# Patient Record
Sex: Female | Born: 1945
Health system: Southern US, Community
[De-identification: ages and names within clinical notes are randomized; demographics above are authoritative.]

## PROBLEM LIST (undated history)

## (undated) DIAGNOSIS — Z8041 Family history of malignant neoplasm of ovary: Secondary | ICD-10-CM

## (undated) DIAGNOSIS — T7840XA Allergy, unspecified, initial encounter: Secondary | ICD-10-CM

## (undated) DIAGNOSIS — C50919 Malignant neoplasm of unspecified site of unspecified female breast: Secondary | ICD-10-CM

## (undated) DIAGNOSIS — K5900 Constipation, unspecified: Secondary | ICD-10-CM

## (undated) DIAGNOSIS — Z803 Family history of malignant neoplasm of breast: Secondary | ICD-10-CM

## (undated) DIAGNOSIS — Z800 Family history of malignant neoplasm of digestive organs: Secondary | ICD-10-CM

## (undated) HISTORY — DX: Malignant neoplasm of unspecified site of unspecified female breast: C50.919

## (undated) HISTORY — DX: Constipation, unspecified: K59.00

## (undated) HISTORY — DX: Allergy, unspecified, initial encounter: T78.40XA

## (undated) HISTORY — DX: Family history of malignant neoplasm of digestive organs: Z80.0

## (undated) HISTORY — DX: Family history of malignant neoplasm of breast: Z80.3

## (undated) HISTORY — DX: Family history of malignant neoplasm of ovary: Z80.41

---

## 1983-06-05 HISTORY — PX: BUNIONECTOMY: SHX129

## 1999-08-01 ENCOUNTER — Encounter: Admission: RE | Admit: 1999-08-01 | Discharge: 1999-08-01 | Payer: Self-pay | Admitting: Obstetrics and Gynecology

## 1999-08-01 ENCOUNTER — Encounter: Payer: Self-pay | Admitting: Obstetrics and Gynecology

## 2000-08-05 ENCOUNTER — Encounter: Payer: Self-pay | Admitting: Obstetrics and Gynecology

## 2000-08-05 ENCOUNTER — Encounter: Admission: RE | Admit: 2000-08-05 | Discharge: 2000-08-05 | Payer: Self-pay | Admitting: Obstetrics and Gynecology

## 2000-10-14 ENCOUNTER — Ambulatory Visit (HOSPITAL_COMMUNITY): Admission: RE | Admit: 2000-10-14 | Discharge: 2000-10-14 | Payer: Self-pay | Admitting: *Deleted

## 2001-08-06 ENCOUNTER — Encounter: Admission: RE | Admit: 2001-08-06 | Discharge: 2001-08-06 | Payer: Self-pay | Admitting: Obstetrics and Gynecology

## 2001-08-06 ENCOUNTER — Encounter: Payer: Self-pay | Admitting: Obstetrics and Gynecology

## 2002-08-11 ENCOUNTER — Encounter: Payer: Self-pay | Admitting: Obstetrics and Gynecology

## 2002-08-11 ENCOUNTER — Encounter: Admission: RE | Admit: 2002-08-11 | Discharge: 2002-08-11 | Payer: Self-pay | Admitting: Obstetrics and Gynecology

## 2004-08-24 ENCOUNTER — Ambulatory Visit (HOSPITAL_COMMUNITY): Admission: RE | Admit: 2004-08-24 | Discharge: 2004-08-24 | Payer: Self-pay | Admitting: Obstetrics and Gynecology

## 2005-11-02 ENCOUNTER — Ambulatory Visit (HOSPITAL_COMMUNITY): Admission: RE | Admit: 2005-11-02 | Discharge: 2005-11-02 | Payer: Self-pay | Admitting: Obstetrics and Gynecology

## 2006-11-04 ENCOUNTER — Ambulatory Visit (HOSPITAL_COMMUNITY): Admission: RE | Admit: 2006-11-04 | Discharge: 2006-11-04 | Payer: Self-pay | Admitting: Obstetrics and Gynecology

## 2007-06-03 ENCOUNTER — Ambulatory Visit: Payer: Self-pay | Admitting: Gastroenterology

## 2007-06-05 DIAGNOSIS — C50919 Malignant neoplasm of unspecified site of unspecified female breast: Secondary | ICD-10-CM

## 2007-06-05 HISTORY — PX: BREAST LUMPECTOMY: SHX2

## 2007-06-05 HISTORY — DX: Malignant neoplasm of unspecified site of unspecified female breast: C50.919

## 2007-06-18 ENCOUNTER — Ambulatory Visit: Payer: Self-pay | Admitting: Gastroenterology

## 2007-11-28 ENCOUNTER — Ambulatory Visit (HOSPITAL_COMMUNITY): Admission: RE | Admit: 2007-11-28 | Discharge: 2007-11-28 | Payer: Self-pay | Admitting: Internal Medicine

## 2007-12-15 ENCOUNTER — Encounter: Admission: RE | Admit: 2007-12-15 | Discharge: 2007-12-15 | Payer: Self-pay | Admitting: Internal Medicine

## 2007-12-19 ENCOUNTER — Encounter (INDEPENDENT_AMBULATORY_CARE_PROVIDER_SITE_OTHER): Payer: Self-pay | Admitting: Diagnostic Radiology

## 2007-12-19 ENCOUNTER — Encounter: Admission: RE | Admit: 2007-12-19 | Discharge: 2007-12-19 | Payer: Self-pay | Admitting: Internal Medicine

## 2007-12-31 ENCOUNTER — Encounter: Admission: RE | Admit: 2007-12-31 | Discharge: 2007-12-31 | Payer: Self-pay | Admitting: Internal Medicine

## 2008-01-09 ENCOUNTER — Encounter: Admission: RE | Admit: 2008-01-09 | Discharge: 2008-01-09 | Payer: Self-pay | Admitting: General Surgery

## 2008-01-09 ENCOUNTER — Ambulatory Visit (HOSPITAL_COMMUNITY): Admission: RE | Admit: 2008-01-09 | Discharge: 2008-01-09 | Payer: Self-pay | Admitting: General Surgery

## 2008-01-09 ENCOUNTER — Encounter (INDEPENDENT_AMBULATORY_CARE_PROVIDER_SITE_OTHER): Payer: Self-pay | Admitting: General Surgery

## 2008-01-16 ENCOUNTER — Ambulatory Visit: Payer: Self-pay | Admitting: Hematology

## 2008-02-02 LAB — CBC WITH DIFFERENTIAL/PLATELET
Eosinophils Absolute: 0.2 10*3/uL (ref 0.0–0.5)
LYMPH%: 30.2 % (ref 14.0–48.0)
MCH: 31.5 pg (ref 26.0–34.0)
MCHC: 34.5 g/dL (ref 32.0–36.0)
MCV: 91.1 fL (ref 81.0–101.0)
MONO%: 8.3 % (ref 0.0–13.0)
NEUT#: 3.8 10*3/uL (ref 1.5–6.5)
Platelets: 228 10*3/uL (ref 145–400)
RBC: 4.23 10*6/uL (ref 3.70–5.32)

## 2008-02-02 LAB — COMPREHENSIVE METABOLIC PANEL
ALT: 23 U/L (ref 0–35)
Albumin: 4.6 g/dL (ref 3.5–5.2)
CO2: 28 mEq/L (ref 19–32)
Calcium: 10 mg/dL (ref 8.4–10.5)
Chloride: 105 mEq/L (ref 96–112)
Potassium: 4.3 mEq/L (ref 3.5–5.3)
Sodium: 141 mEq/L (ref 135–145)
Total Protein: 7.1 g/dL (ref 6.0–8.3)

## 2008-02-04 ENCOUNTER — Ambulatory Visit: Admission: RE | Admit: 2008-02-04 | Discharge: 2008-02-17 | Payer: Self-pay | Admitting: Radiation Oncology

## 2008-02-06 ENCOUNTER — Ambulatory Visit (HOSPITAL_COMMUNITY): Admission: RE | Admit: 2008-02-06 | Discharge: 2008-02-06 | Payer: Self-pay | Admitting: Oncology

## 2008-02-11 ENCOUNTER — Ambulatory Visit (HOSPITAL_COMMUNITY): Admission: RE | Admit: 2008-02-11 | Discharge: 2008-02-11 | Payer: Self-pay | Admitting: Oncology

## 2008-02-11 LAB — COMPREHENSIVE METABOLIC PANEL
Albumin: 4.4 g/dL (ref 3.5–5.2)
BUN: 18 mg/dL (ref 6–23)
CO2: 27 mEq/L (ref 19–32)
Calcium: 9.3 mg/dL (ref 8.4–10.5)
Glucose, Bld: 126 mg/dL — ABNORMAL HIGH (ref 70–99)
Potassium: 4 mEq/L (ref 3.5–5.3)
Sodium: 141 mEq/L (ref 135–145)
Total Protein: 6.9 g/dL (ref 6.0–8.3)

## 2008-02-11 LAB — CBC WITH DIFFERENTIAL/PLATELET
Basophils Absolute: 0.1 10*3/uL (ref 0.0–0.1)
Eosinophils Absolute: 0.2 10*3/uL (ref 0.0–0.5)
HGB: 13.3 g/dL (ref 11.6–15.9)
LYMPH%: 30.1 % (ref 14.0–48.0)
MCV: 88 fL (ref 81.0–101.0)
MONO#: 0.4 10*3/uL (ref 0.1–0.9)
MONO%: 6.2 % (ref 0.0–13.0)
NEUT#: 3.9 10*3/uL (ref 1.5–6.5)
Platelets: 189 10*3/uL (ref 145–400)
RBC: 4.22 10*6/uL (ref 3.70–5.32)
RDW: 12 % (ref 11.3–14.5)
WBC: 6.5 10*3/uL (ref 3.9–10.0)

## 2008-02-18 LAB — CBC WITH DIFFERENTIAL/PLATELET
Basophils Absolute: 0 10*3/uL (ref 0.0–0.1)
EOS%: 16.1 % — ABNORMAL HIGH (ref 0.0–7.0)
Eosinophils Absolute: 0.3 10*3/uL (ref 0.0–0.5)
HCT: 31.9 % — ABNORMAL LOW (ref 34.8–46.6)
HGB: 11.2 g/dL — ABNORMAL LOW (ref 11.6–15.9)
MCH: 31.8 pg (ref 26.0–34.0)
MCV: 90.6 fL (ref 81.0–101.0)
MONO%: 5.2 % (ref 0.0–13.0)
NEUT#: 0.3 10*3/uL — CL (ref 1.5–6.5)
NEUT%: 13.9 % — ABNORMAL LOW (ref 39.6–76.8)
Platelets: 100 10*3/uL — ABNORMAL LOW (ref 145–400)
RDW: 12.9 % (ref 11.3–14.5)

## 2008-02-24 ENCOUNTER — Encounter: Admission: RE | Admit: 2008-02-24 | Discharge: 2008-02-24 | Payer: Self-pay | Admitting: Interventional Radiology

## 2008-02-25 LAB — COMPREHENSIVE METABOLIC PANEL
AST: 17 U/L (ref 0–37)
Alkaline Phosphatase: 81 U/L (ref 39–117)
BUN: 14 mg/dL (ref 6–23)
Glucose, Bld: 94 mg/dL (ref 70–99)
Sodium: 141 mEq/L (ref 135–145)
Total Bilirubin: 0.4 mg/dL (ref 0.3–1.2)

## 2008-02-25 LAB — CBC WITH DIFFERENTIAL/PLATELET
Basophils Absolute: 0.1 10*3/uL (ref 0.0–0.1)
EOS%: 0.6 % (ref 0.0–7.0)
Eosinophils Absolute: 0 10*3/uL (ref 0.0–0.5)
LYMPH%: 26.3 % (ref 14.0–48.0)
MCH: 31.1 pg (ref 26.0–34.0)
MCV: 89.7 fL (ref 81.0–101.0)
MONO%: 6.1 % (ref 0.0–13.0)
NEUT#: 4.1 10*3/uL (ref 1.5–6.5)
Platelets: 171 10*3/uL (ref 145–400)
RBC: 3.89 10*6/uL (ref 3.70–5.32)

## 2008-03-03 ENCOUNTER — Ambulatory Visit: Payer: Self-pay | Admitting: Hematology

## 2008-03-03 LAB — CBC WITH DIFFERENTIAL/PLATELET
Basophils Absolute: 0 10*3/uL (ref 0.0–0.1)
Eosinophils Absolute: 0.1 10*3/uL (ref 0.0–0.5)
HCT: 31.6 % — ABNORMAL LOW (ref 34.8–46.6)
HGB: 11.1 g/dL — ABNORMAL LOW (ref 11.6–15.9)
LYMPH%: 63.2 % — ABNORMAL HIGH (ref 14.0–48.0)
MCV: 90.8 fL (ref 81.0–101.0)
MONO%: 7.5 % (ref 0.0–13.0)
NEUT#: 0.4 10*3/uL — CL (ref 1.5–6.5)
NEUT%: 22.8 % — ABNORMAL LOW (ref 39.6–76.8)
Platelets: 134 10*3/uL — ABNORMAL LOW (ref 145–400)

## 2008-03-10 LAB — CBC WITH DIFFERENTIAL/PLATELET
BASO%: 1.8 % (ref 0.0–2.0)
Basophils Absolute: 0.2 10*3/uL — ABNORMAL HIGH (ref 0.0–0.1)
EOS%: 0.3 % (ref 0.0–7.0)
HCT: 34.2 % — ABNORMAL LOW (ref 34.8–46.6)
HGB: 12 g/dL (ref 11.6–15.9)
LYMPH%: 18.1 % (ref 14.0–48.0)
MCH: 31.3 pg (ref 26.0–34.0)
MCHC: 35 g/dL (ref 32.0–36.0)
MONO#: 0.5 10*3/uL (ref 0.1–0.9)
NEUT%: 74.5 % (ref 39.6–76.8)
Platelets: 160 10*3/uL (ref 145–400)
lymph#: 1.6 10*3/uL (ref 0.9–3.3)

## 2008-03-10 LAB — COMPREHENSIVE METABOLIC PANEL
ALT: 14 U/L (ref 0–35)
AST: 16 U/L (ref 0–37)
BUN: 16 mg/dL (ref 6–23)
Calcium: 9.4 mg/dL (ref 8.4–10.5)
Creatinine, Ser: 0.56 mg/dL (ref 0.40–1.20)
Total Bilirubin: 0.3 mg/dL (ref 0.3–1.2)

## 2008-03-17 LAB — CBC WITH DIFFERENTIAL/PLATELET
BASO%: 1.3 % (ref 0.0–2.0)
Basophils Absolute: 0 10*3/uL (ref 0.0–0.1)
EOS%: 4.8 % (ref 0.0–7.0)
HCT: 29.7 % — ABNORMAL LOW (ref 34.8–46.6)
HGB: 10.4 g/dL — ABNORMAL LOW (ref 11.6–15.9)
MCH: 31.8 pg (ref 26.0–34.0)
MCHC: 35 g/dL (ref 32.0–36.0)
MCV: 91 fL (ref 81.0–101.0)
MONO%: 7.9 % (ref 0.0–13.0)
NEUT%: 39.5 % — ABNORMAL LOW (ref 39.6–76.8)

## 2008-03-17 LAB — COMPREHENSIVE METABOLIC PANEL
ALT: 13 U/L (ref 0–35)
AST: 12 U/L (ref 0–37)
Alkaline Phosphatase: 110 U/L (ref 39–117)
BUN: 11 mg/dL (ref 6–23)
Calcium: 9.1 mg/dL (ref 8.4–10.5)
Creatinine, Ser: 0.54 mg/dL (ref 0.40–1.20)
Total Bilirubin: 0.6 mg/dL (ref 0.3–1.2)

## 2008-03-24 LAB — CBC WITH DIFFERENTIAL/PLATELET
Eosinophils Absolute: 0 10*3/uL (ref 0.0–0.5)
HCT: 32.3 % — ABNORMAL LOW (ref 34.8–46.6)
LYMPH%: 16 % (ref 14.0–48.0)
MCV: 88.7 fL (ref 81.0–101.0)
MONO%: 6.7 % (ref 0.0–13.0)
NEUT#: 6.2 10*3/uL (ref 1.5–6.5)
NEUT%: 75.5 % (ref 39.6–76.8)
Platelets: 169 10*3/uL (ref 145–400)
RBC: 3.64 10*6/uL — ABNORMAL LOW (ref 3.70–5.32)

## 2008-03-24 LAB — COMPREHENSIVE METABOLIC PANEL
Alkaline Phosphatase: 97 U/L (ref 39–117)
BUN: 12 mg/dL (ref 6–23)
CO2: 24 mEq/L (ref 19–32)
Creatinine, Ser: 0.6 mg/dL (ref 0.40–1.20)
Glucose, Bld: 88 mg/dL (ref 70–99)
Sodium: 140 mEq/L (ref 135–145)
Total Bilirubin: 0.4 mg/dL (ref 0.3–1.2)
Total Protein: 6.6 g/dL (ref 6.0–8.3)

## 2008-03-31 LAB — COMPREHENSIVE METABOLIC PANEL
Alkaline Phosphatase: 110 U/L (ref 39–117)
BUN: 15 mg/dL (ref 6–23)
CO2: 25 mEq/L (ref 19–32)
Glucose, Bld: 91 mg/dL (ref 70–99)
Total Bilirubin: 0.5 mg/dL (ref 0.3–1.2)
Total Protein: 6.2 g/dL (ref 6.0–8.3)

## 2008-03-31 LAB — CBC WITH DIFFERENTIAL/PLATELET
Basophils Absolute: 0 10*3/uL (ref 0.0–0.1)
Eosinophils Absolute: 0.1 10*3/uL (ref 0.0–0.5)
HCT: 28.9 % — ABNORMAL LOW (ref 34.8–46.6)
HGB: 10.2 g/dL — ABNORMAL LOW (ref 11.6–15.9)
LYMPH%: 43.3 % (ref 14.0–48.0)
MCV: 91.5 fL (ref 81.0–101.0)
MONO#: 0.1 10*3/uL (ref 0.1–0.9)
MONO%: 6.2 % (ref 0.0–13.0)
NEUT#: 0.6 10*3/uL — ABNORMAL LOW (ref 1.5–6.5)
NEUT%: 43.2 % (ref 39.6–76.8)
Platelets: 116 10*3/uL — ABNORMAL LOW (ref 145–400)
WBC: 1.3 10*3/uL — ABNORMAL LOW (ref 3.9–10.0)

## 2008-04-07 LAB — CBC WITH DIFFERENTIAL/PLATELET
Basophils Absolute: 0.1 10*3/uL (ref 0.0–0.1)
EOS%: 0.5 % (ref 0.0–7.0)
HCT: 31.6 % — ABNORMAL LOW (ref 34.8–46.6)
HGB: 11.1 g/dL — ABNORMAL LOW (ref 11.6–15.9)
MCH: 31.4 pg (ref 26.0–34.0)
MCV: 89.5 fL (ref 81.0–101.0)
MONO%: 6.4 % (ref 0.0–13.0)
NEUT%: 79.2 % — ABNORMAL HIGH (ref 39.6–76.8)
Platelets: 163 10*3/uL (ref 145–400)

## 2008-04-07 LAB — COMPREHENSIVE METABOLIC PANEL
AST: 18 U/L (ref 0–37)
Alkaline Phosphatase: 87 U/L (ref 39–117)
BUN: 13 mg/dL (ref 6–23)
Calcium: 9.7 mg/dL (ref 8.4–10.5)
Creatinine, Ser: 0.58 mg/dL (ref 0.40–1.20)

## 2008-04-19 ENCOUNTER — Ambulatory Visit: Payer: Self-pay | Admitting: Oncology

## 2008-04-21 LAB — COMPREHENSIVE METABOLIC PANEL
AST: 21 U/L (ref 0–37)
Albumin: 4.3 g/dL (ref 3.5–5.2)
BUN: 15 mg/dL (ref 6–23)
CO2: 24 mEq/L (ref 19–32)
Calcium: 9.2 mg/dL (ref 8.4–10.5)
Chloride: 105 mEq/L (ref 96–112)
Creatinine, Ser: 0.59 mg/dL (ref 0.40–1.20)
Glucose, Bld: 83 mg/dL (ref 70–99)

## 2008-04-21 LAB — CBC WITH DIFFERENTIAL/PLATELET
Basophils Absolute: 0.2 10*3/uL — ABNORMAL HIGH (ref 0.0–0.1)
EOS%: 0.8 % (ref 0.0–7.0)
Eosinophils Absolute: 0.1 10*3/uL (ref 0.0–0.5)
HCT: 31.2 % — ABNORMAL LOW (ref 34.8–46.6)
HGB: 11 g/dL — ABNORMAL LOW (ref 11.6–15.9)
MCH: 31.9 pg (ref 26.0–34.0)
NEUT#: 14.8 10*3/uL — ABNORMAL HIGH (ref 1.5–6.5)
NEUT%: 83.9 % — ABNORMAL HIGH (ref 39.6–76.8)
lymph#: 1.5 10*3/uL (ref 0.9–3.3)

## 2008-04-28 LAB — CBC WITH DIFFERENTIAL/PLATELET
BASO%: 0.3 % (ref 0.0–2.0)
Basophils Absolute: 0.1 10*3/uL (ref 0.0–0.1)
EOS%: 2.6 % (ref 0.0–7.0)
HCT: 27.7 % — ABNORMAL LOW (ref 34.8–46.6)
HGB: 9.7 g/dL — ABNORMAL LOW (ref 11.6–15.9)
LYMPH%: 7.5 % — ABNORMAL LOW (ref 14.0–48.0)
MCH: 32.7 pg (ref 26.0–34.0)
MCHC: 34.9 g/dL (ref 32.0–36.0)
MCV: 93.6 fL (ref 81.0–101.0)
MONO%: 2.7 % (ref 0.0–13.0)
NEUT%: 86.9 % — ABNORMAL HIGH (ref 39.6–76.8)
Platelets: 146 10*3/uL (ref 145–400)

## 2008-05-05 LAB — CBC WITH DIFFERENTIAL/PLATELET
Basophils Absolute: 0.1 10*3/uL (ref 0.0–0.1)
EOS%: 3.3 % (ref 0.0–7.0)
LYMPH%: 7.5 % — ABNORMAL LOW (ref 14.0–48.0)
MCH: 32.2 pg (ref 26.0–34.0)
MCV: 91.7 fL (ref 81.0–101.0)
MONO%: 5.8 % (ref 0.0–13.0)
Platelets: 176 10*3/uL (ref 145–400)
RBC: 3.33 10*6/uL — ABNORMAL LOW (ref 3.70–5.32)
RDW: 16.3 % — ABNORMAL HIGH (ref 11.3–14.5)

## 2008-05-05 LAB — COMPREHENSIVE METABOLIC PANEL
AST: 32 U/L (ref 0–37)
Albumin: 4.6 g/dL (ref 3.5–5.2)
Alkaline Phosphatase: 128 U/L — ABNORMAL HIGH (ref 39–117)
BUN: 14 mg/dL (ref 6–23)
Potassium: 4.4 mEq/L (ref 3.5–5.3)
Sodium: 141 mEq/L (ref 135–145)
Total Bilirubin: 0.5 mg/dL (ref 0.3–1.2)

## 2008-05-07 ENCOUNTER — Ambulatory Visit: Payer: Self-pay | Admitting: Genetic Counselor

## 2008-05-12 LAB — CBC WITH DIFFERENTIAL/PLATELET
Basophils Absolute: 0.1 10*3/uL (ref 0.0–0.1)
EOS%: 3.1 % (ref 0.0–7.0)
Eosinophils Absolute: 0.5 10*3/uL (ref 0.0–0.5)
HGB: 9.3 g/dL — ABNORMAL LOW (ref 11.6–15.9)
LYMPH%: 6.4 % — ABNORMAL LOW (ref 14.0–48.0)
MCH: 32.4 pg (ref 26.0–34.0)
MCV: 95.4 fL (ref 81.0–101.0)
MONO%: 5.7 % (ref 0.0–13.0)
NEUT#: 12.2 10*3/uL — ABNORMAL HIGH (ref 1.5–6.5)
NEUT%: 84.3 % — ABNORMAL HIGH (ref 39.6–76.8)
Platelets: 157 10*3/uL (ref 145–400)
RDW: 17 % — ABNORMAL HIGH (ref 11.3–14.5)

## 2008-05-12 LAB — COMPREHENSIVE METABOLIC PANEL
AST: 17 U/L (ref 0–37)
Albumin: 4.3 g/dL (ref 3.5–5.2)
Alkaline Phosphatase: 148 U/L — ABNORMAL HIGH (ref 39–117)
BUN: 13 mg/dL (ref 6–23)
Creatinine, Ser: 0.56 mg/dL (ref 0.40–1.20)
Glucose, Bld: 100 mg/dL — ABNORMAL HIGH (ref 70–99)
Total Bilirubin: 0.6 mg/dL (ref 0.3–1.2)

## 2008-05-19 LAB — COMPREHENSIVE METABOLIC PANEL
AST: 26 U/L (ref 0–37)
BUN: 14 mg/dL (ref 6–23)
Calcium: 9 mg/dL (ref 8.4–10.5)
Chloride: 107 mEq/L (ref 96–112)
Creatinine, Ser: 0.54 mg/dL (ref 0.40–1.20)

## 2008-05-19 LAB — CBC WITH DIFFERENTIAL/PLATELET
Basophils Absolute: 0 10*3/uL (ref 0.0–0.1)
EOS%: 2.6 % (ref 0.0–7.0)
Eosinophils Absolute: 0.2 10*3/uL (ref 0.0–0.5)
HCT: 32.4 % — ABNORMAL LOW (ref 34.8–46.6)
HGB: 11 g/dL — ABNORMAL LOW (ref 11.6–15.9)
MCH: 31.4 pg (ref 26.0–34.0)
MCV: 92.7 fL (ref 81.0–101.0)
MONO%: 5.3 % (ref 0.0–13.0)
NEUT#: 7.3 10*3/uL — ABNORMAL HIGH (ref 1.5–6.5)
NEUT%: 81.8 % — ABNORMAL HIGH (ref 39.6–76.8)
RDW: 14.9 % — ABNORMAL HIGH (ref 11.3–14.5)

## 2008-05-26 LAB — CBC WITH DIFFERENTIAL/PLATELET
Basophils Absolute: 0 10*3/uL (ref 0.0–0.1)
HCT: 27.8 % — ABNORMAL LOW (ref 34.8–46.6)
HGB: 9.6 g/dL — ABNORMAL LOW (ref 11.6–15.9)
MCH: 32.4 pg (ref 26.0–34.0)
MONO#: 0.3 10*3/uL (ref 0.1–0.9)
NEUT%: 74.8 % (ref 39.6–76.8)
WBC: 4.1 10*3/uL (ref 3.9–10.0)
lymph#: 0.6 10*3/uL — ABNORMAL LOW (ref 0.9–3.3)

## 2008-06-07 ENCOUNTER — Ambulatory Visit: Payer: Self-pay | Admitting: Oncology

## 2008-06-07 LAB — COMPREHENSIVE METABOLIC PANEL WITH GFR
ALT: 23 U/L (ref 0–35)
AST: 26 U/L (ref 0–37)
Albumin: 4.6 g/dL (ref 3.5–5.2)
Alkaline Phosphatase: 82 U/L (ref 39–117)
BUN: 11 mg/dL (ref 6–23)
CO2: 23 meq/L (ref 19–32)
Calcium: 9.3 mg/dL (ref 8.4–10.5)
Chloride: 107 meq/L (ref 96–112)
Creatinine, Ser: 0.6 mg/dL (ref 0.40–1.20)
Glucose, Bld: 86 mg/dL (ref 70–99)
Potassium: 4.2 meq/L (ref 3.5–5.3)
Sodium: 142 meq/L (ref 135–145)
Total Bilirubin: 0.7 mg/dL (ref 0.3–1.2)
Total Protein: 6.6 g/dL (ref 6.0–8.3)

## 2008-06-07 LAB — CBC WITH DIFFERENTIAL/PLATELET
BASO%: 0.6 % (ref 0.0–2.0)
Basophils Absolute: 0 10e3/uL (ref 0.0–0.1)
EOS%: 3.2 % (ref 0.0–7.0)
Eosinophils Absolute: 0.1 10e3/uL (ref 0.0–0.5)
HCT: 34.1 % — ABNORMAL LOW (ref 34.8–46.6)
HGB: 11.6 g/dL (ref 11.6–15.9)
LYMPH%: 23.2 % (ref 14.0–48.0)
MCH: 32 pg (ref 26.0–34.0)
MCHC: 34.1 g/dL (ref 32.0–36.0)
MCV: 94 fL (ref 81.0–101.0)
MONO#: 0.4 10e3/uL (ref 0.1–0.9)
MONO%: 15.8 % — ABNORMAL HIGH (ref 0.0–13.0)
NEUT#: 1.6 10e3/uL (ref 1.5–6.5)
NEUT%: 57.2 % (ref 39.6–76.8)
Platelets: 224 10e3/uL (ref 145–400)
RBC: 3.63 10e6/uL — ABNORMAL LOW (ref 3.70–5.32)
RDW: 14.8 % — ABNORMAL HIGH (ref 11.3–14.5)
WBC: 2.8 10e3/uL — ABNORMAL LOW (ref 3.9–10.0)
lymph#: 0.7 10e3/uL — ABNORMAL LOW (ref 0.9–3.3)

## 2008-06-08 ENCOUNTER — Ambulatory Visit: Admission: RE | Admit: 2008-06-08 | Discharge: 2008-07-26 | Payer: Self-pay | Admitting: Radiation Oncology

## 2008-07-08 LAB — CBC WITH DIFFERENTIAL/PLATELET
Basophils Absolute: 0 10*3/uL (ref 0.0–0.1)
EOS%: 2.5 % (ref 0.0–7.0)
HCT: 34.8 % (ref 34.8–46.6)
HGB: 12 g/dL (ref 11.6–15.9)
MCH: 31.1 pg (ref 26.0–34.0)
MCV: 90.3 fL (ref 81.0–101.0)
MONO%: 11 % (ref 0.0–13.0)
NEUT%: 67.3 % (ref 39.6–76.8)
Platelets: 154 10*3/uL (ref 145–400)

## 2008-08-09 ENCOUNTER — Ambulatory Visit: Payer: Self-pay | Admitting: Oncology

## 2008-08-11 LAB — CBC WITH DIFFERENTIAL/PLATELET
Basophils Absolute: 0 10*3/uL (ref 0.0–0.1)
EOS%: 1.6 % (ref 0.0–7.0)
HGB: 11.9 g/dL (ref 11.6–15.9)
MCH: 30.4 pg (ref 25.1–34.0)
MCV: 87.9 fL (ref 79.5–101.0)
MONO%: 13.2 % (ref 0.0–14.0)
NEUT%: 64.3 % (ref 38.4–76.8)
RDW: 13.7 % (ref 11.2–14.5)

## 2008-08-11 LAB — COMPREHENSIVE METABOLIC PANEL
AST: 19 U/L (ref 0–37)
Alkaline Phosphatase: 70 U/L (ref 39–117)
BUN: 13 mg/dL (ref 6–23)
Creatinine, Ser: 0.56 mg/dL (ref 0.40–1.20)
Potassium: 4.8 mEq/L (ref 3.5–5.3)
Total Bilirubin: 0.5 mg/dL (ref 0.3–1.2)

## 2008-08-23 ENCOUNTER — Ambulatory Visit (HOSPITAL_COMMUNITY): Admission: RE | Admit: 2008-08-23 | Discharge: 2008-08-23 | Payer: Self-pay | Admitting: Oncology

## 2008-11-29 ENCOUNTER — Encounter: Admission: RE | Admit: 2008-11-29 | Discharge: 2008-11-29 | Payer: Self-pay | Admitting: Oncology

## 2008-12-01 ENCOUNTER — Ambulatory Visit: Payer: Self-pay | Admitting: Oncology

## 2008-12-03 LAB — COMPREHENSIVE METABOLIC PANEL
AST: 21 U/L (ref 0–37)
Albumin: 4.2 g/dL (ref 3.5–5.2)
Alkaline Phosphatase: 66 U/L (ref 39–117)
BUN: 11 mg/dL (ref 6–23)
Potassium: 4.1 mEq/L (ref 3.5–5.3)
Sodium: 144 mEq/L (ref 135–145)

## 2008-12-03 LAB — CBC WITH DIFFERENTIAL/PLATELET
Basophils Absolute: 0 10*3/uL (ref 0.0–0.1)
EOS%: 3.2 % (ref 0.0–7.0)
Eosinophils Absolute: 0.1 10*3/uL (ref 0.0–0.5)
LYMPH%: 24.1 % (ref 14.0–49.7)
MCH: 32 pg (ref 25.1–34.0)
MCV: 91.9 fL (ref 79.5–101.0)
MONO%: 8.9 % (ref 0.0–14.0)
Platelets: 180 10*3/uL (ref 145–400)
RBC: 3.88 10*6/uL (ref 3.70–5.45)
RDW: 13 % (ref 11.2–14.5)

## 2009-04-05 ENCOUNTER — Ambulatory Visit: Payer: Self-pay | Admitting: Oncology

## 2009-04-07 LAB — CBC WITH DIFFERENTIAL/PLATELET
BASO%: 0.4 % (ref 0.0–2.0)
EOS%: 2.2 % (ref 0.0–7.0)
Eosinophils Absolute: 0.1 10*3/uL (ref 0.0–0.5)
LYMPH%: 22.3 % (ref 14.0–49.7)
MCH: 32.5 pg (ref 25.1–34.0)
MCHC: 34.9 g/dL (ref 31.5–36.0)
MCV: 93.2 fL (ref 79.5–101.0)
MONO%: 8.7 % (ref 0.0–14.0)
NEUT#: 2.9 10*3/uL (ref 1.5–6.5)
RBC: 3.95 10*6/uL (ref 3.70–5.45)
RDW: 13.1 % (ref 11.2–14.5)

## 2009-04-07 LAB — COMPREHENSIVE METABOLIC PANEL
AST: 19 U/L (ref 0–37)
Albumin: 4.5 g/dL (ref 3.5–5.2)
Alkaline Phosphatase: 62 U/L (ref 39–117)
Potassium: 4.1 mEq/L (ref 3.5–5.3)
Sodium: 143 mEq/L (ref 135–145)
Total Bilirubin: 0.6 mg/dL (ref 0.3–1.2)
Total Protein: 6.7 g/dL (ref 6.0–8.3)

## 2009-08-01 ENCOUNTER — Ambulatory Visit: Payer: Self-pay | Admitting: Oncology

## 2009-08-04 LAB — COMPREHENSIVE METABOLIC PANEL
AST: 22 U/L (ref 0–37)
Alkaline Phosphatase: 56 U/L (ref 39–117)
BUN: 14 mg/dL (ref 6–23)
CO2: 21 mEq/L (ref 19–32)
Chloride: 106 mEq/L (ref 96–112)
Creatinine, Ser: 0.61 mg/dL (ref 0.40–1.20)
Glucose, Bld: 87 mg/dL (ref 70–99)
Potassium: 4.4 mEq/L (ref 3.5–5.3)
Sodium: 141 mEq/L (ref 135–145)
Total Bilirubin: 0.6 mg/dL (ref 0.3–1.2)
Total Protein: 6.8 g/dL (ref 6.0–8.3)

## 2009-08-04 LAB — CBC WITH DIFFERENTIAL/PLATELET
BASO%: 0.6 % (ref 0.0–2.0)
Basophils Absolute: 0 10*3/uL (ref 0.0–0.1)
EOS%: 1.4 % (ref 0.0–7.0)
MONO#: 0.4 10*3/uL (ref 0.1–0.9)
NEUT%: 62.8 % (ref 38.4–76.8)
Platelets: 193 10*3/uL (ref 145–400)

## 2009-11-15 ENCOUNTER — Ambulatory Visit: Payer: Self-pay | Admitting: Oncology

## 2009-11-17 LAB — CBC WITH DIFFERENTIAL/PLATELET
BASO%: 0.5 % (ref 0.0–2.0)
Basophils Absolute: 0 10*3/uL (ref 0.0–0.1)
LYMPH%: 28.6 % (ref 14.0–49.7)
MCHC: 35.1 g/dL (ref 31.5–36.0)
MCV: 91.7 fL (ref 79.5–101.0)
MONO#: 0.5 10*3/uL (ref 0.1–0.9)
NEUT#: 2.9 10*3/uL (ref 1.5–6.5)
NEUT%: 60.1 % (ref 38.4–76.8)
Platelets: 192 10*3/uL (ref 145–400)
RBC: 3.98 10*6/uL (ref 3.70–5.45)
WBC: 4.9 10*3/uL (ref 3.9–10.3)
lymph#: 1.4 10*3/uL (ref 0.9–3.3)

## 2009-11-17 LAB — COMPREHENSIVE METABOLIC PANEL
AST: 21 U/L (ref 0–37)
Albumin: 4.4 g/dL (ref 3.5–5.2)
BUN: 14 mg/dL (ref 6–23)
Glucose, Bld: 96 mg/dL (ref 70–99)

## 2009-11-30 ENCOUNTER — Encounter: Admission: RE | Admit: 2009-11-30 | Discharge: 2009-11-30 | Payer: Self-pay | Admitting: Oncology

## 2010-02-23 ENCOUNTER — Ambulatory Visit: Payer: Self-pay | Admitting: Oncology

## 2010-02-27 LAB — COMPREHENSIVE METABOLIC PANEL
AST: 22 U/L (ref 0–37)
Creatinine, Ser: 0.55 mg/dL (ref 0.40–1.20)
Potassium: 4.2 mEq/L (ref 3.5–5.3)
Sodium: 143 mEq/L (ref 135–145)
Total Protein: 6.3 g/dL (ref 6.0–8.3)

## 2010-02-27 LAB — CBC WITH DIFFERENTIAL/PLATELET
EOS%: 1.2 % (ref 0.0–7.0)
HGB: 12.5 g/dL (ref 11.6–15.9)
MCHC: 34.4 g/dL (ref 31.5–36.0)
MONO#: 0.4 10*3/uL (ref 0.1–0.9)
MONO%: 8.7 % (ref 0.0–14.0)
NEUT#: 2.9 10*3/uL (ref 1.5–6.5)
Platelets: 188 10*3/uL (ref 145–400)
RDW: 13.1 % (ref 11.2–14.5)

## 2010-03-09 IMAGING — CR DG CHEST 2V
2 series · 2 of 2 positions shown · non-contrast
Comparison: None

CLINICAL DATA: History given of right breast carcinoma.

CHEST - 2 VIEW

[view not recorded (1 of 2)]
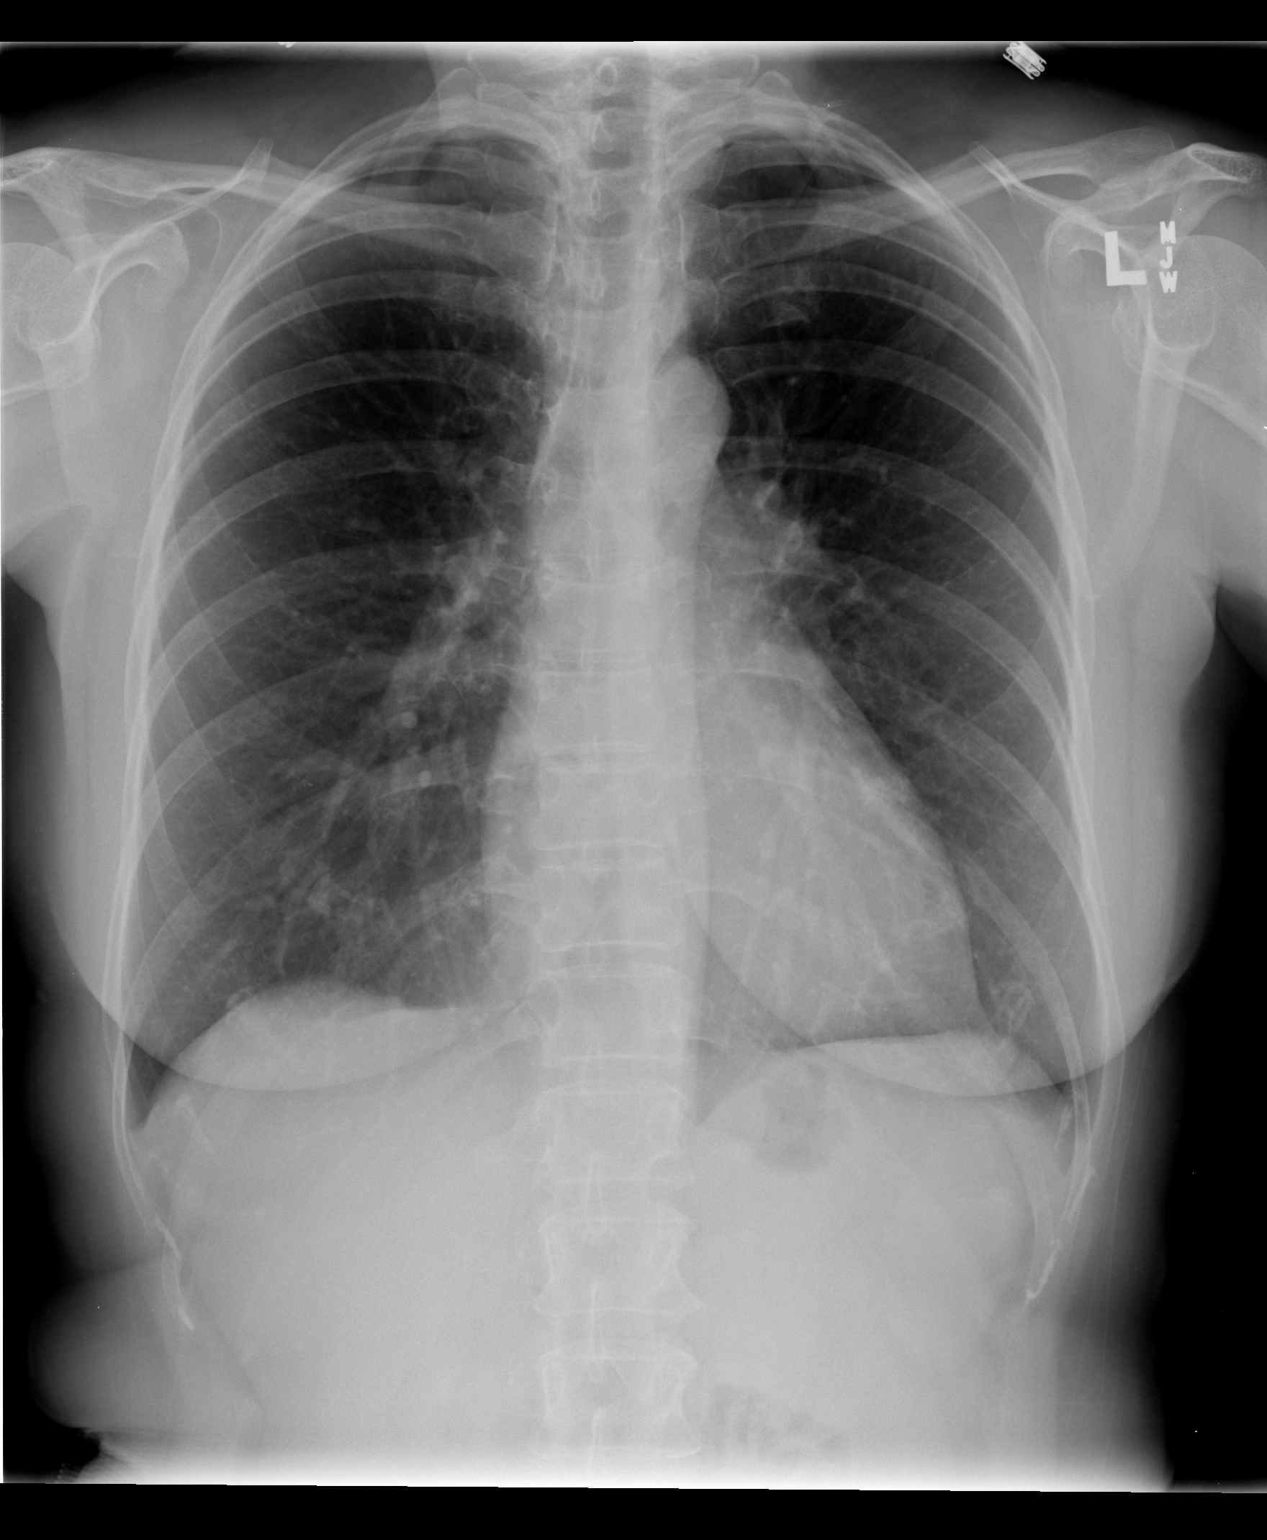

[view not recorded (2 of 2)]
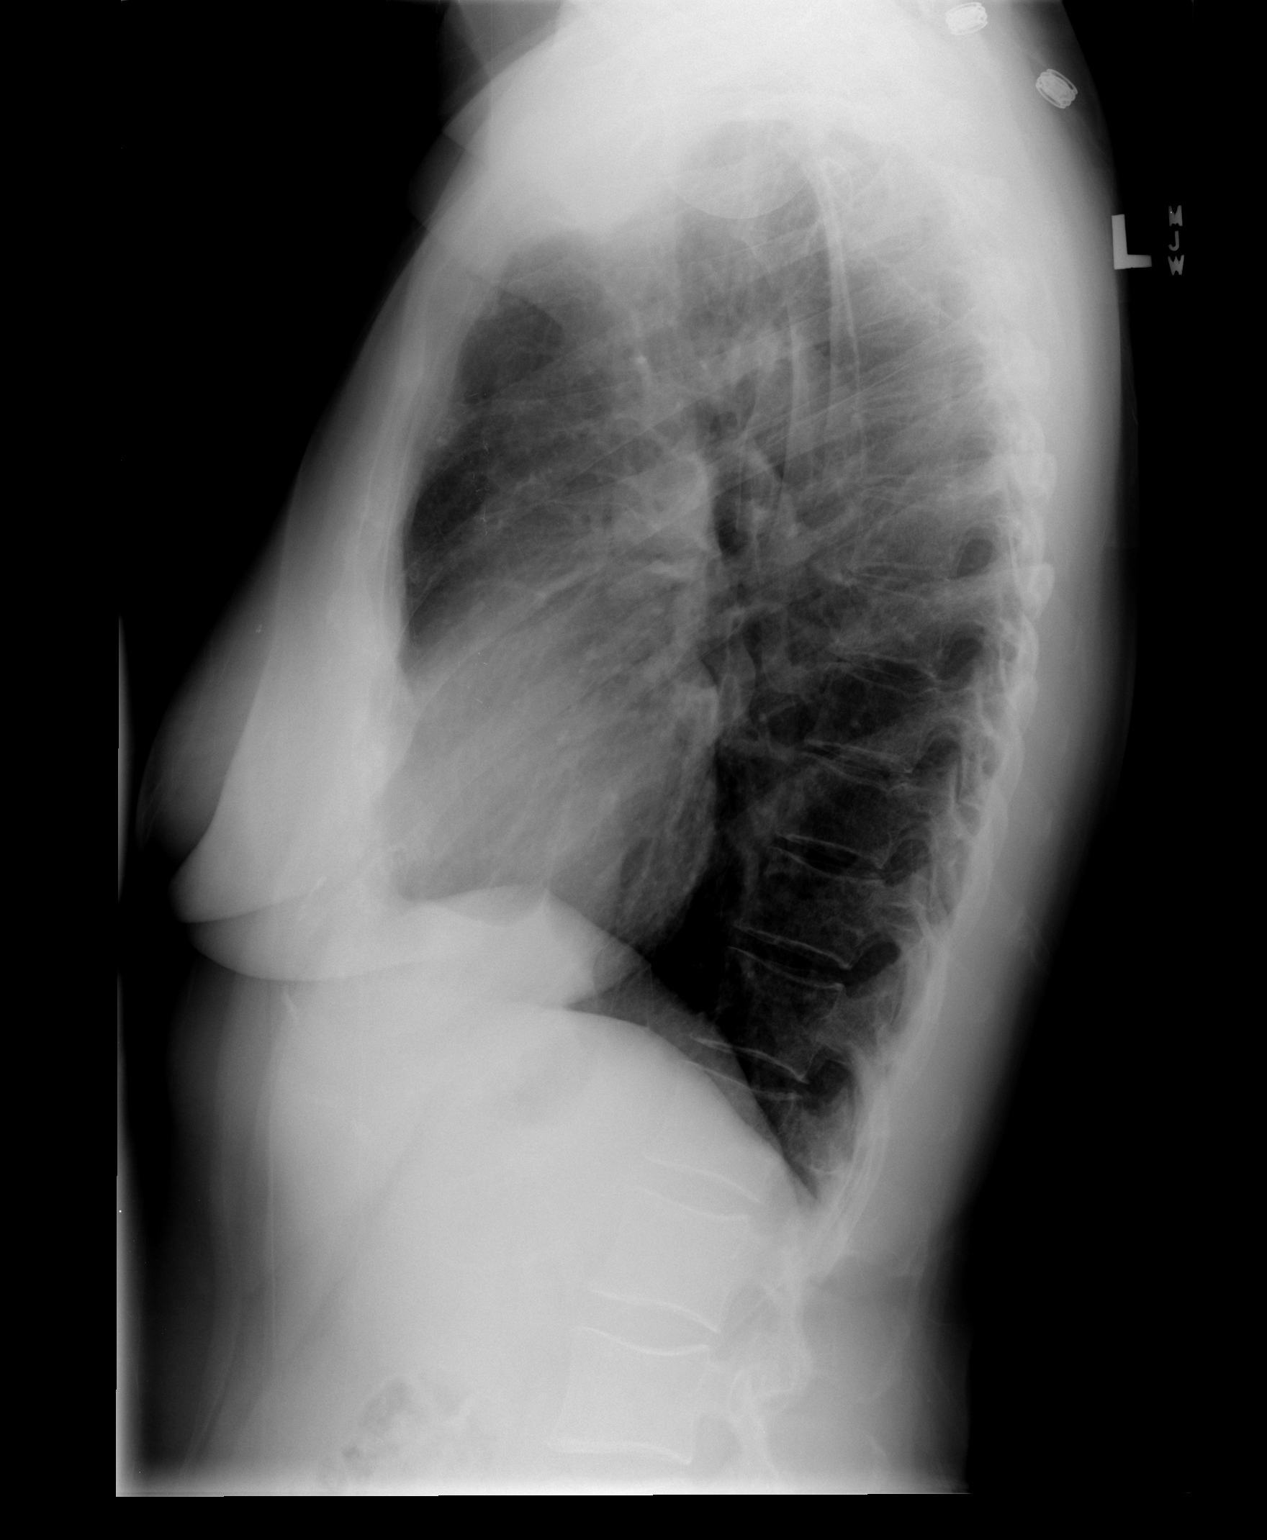

[2 of 2 positions shown; findings below may reference images not displayed]

FINDINGS: Cardiac silhouette is normal size and shape.  Lungs are
free of infiltrates.  No adenopathy or pleural effusion is seen.
Bones are average for age.
IMPRESSION: No acute or active process seen.

## 2010-03-10 IMAGING — MG MM BREAST SURGICAL SPECIMEN
1 series · 1 of 1 positions shown · non-contrast
Comparison: none

January 09, 2008 – DUPLICATE COPY for exam association in [facility]. No change from original report.
CLINICAL DATA: Right breast mass. Preoperative needle
 localization.

[R CC]
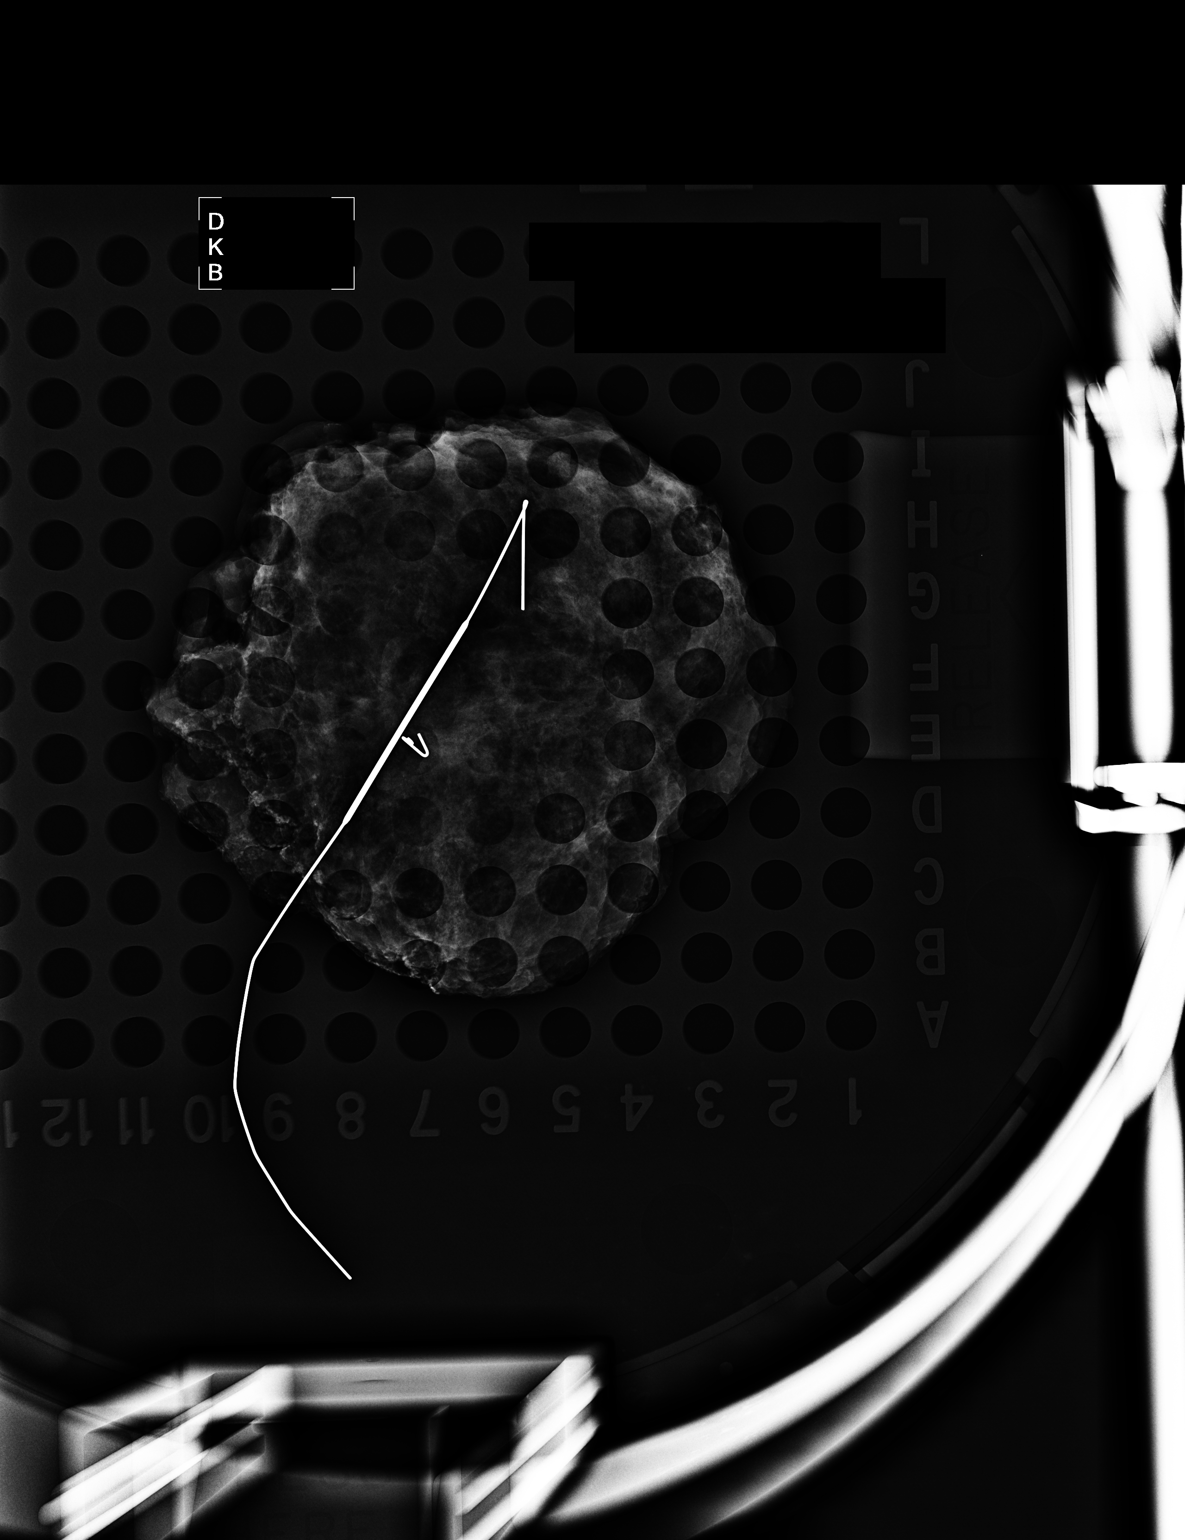

[1 of 1 positions shown; findings below may reference images not displayed]

NEEDLE LOCALIZATION WITH MAMMOGRAPHIC GUIDANCE AND SPECIMEN
 RADIOGRAPH

 Patient presents for needle localization prior to surgical excision
 of the right breast mass located at the 12 o'clock position.. I
 met with the patient and we discussed the procedure of needle
 localization. Informed written consent was given.

 Using mammographic guidance, sterile technique, 2% lidocaine and a
 7 cm modified Kopans needle, the right breast mass was localized
 using a superior approach. The films are marked for Dr. Blain.

 Specimen radiograph was performed at the [REDACTED], and
 confirms the mass, clip, and localization wire to be present in the
 tissue sample. The specimen is marked for pathology.
IMPRESSION: Needle localization right breast. No apparent complications.

## 2010-07-24 ENCOUNTER — Other Ambulatory Visit: Payer: Self-pay | Admitting: Oncology

## 2010-07-24 ENCOUNTER — Encounter (HOSPITAL_BASED_OUTPATIENT_CLINIC_OR_DEPARTMENT_OTHER): Payer: BC Managed Care – PPO | Admitting: Oncology

## 2010-07-24 DIAGNOSIS — C50919 Malignant neoplasm of unspecified site of unspecified female breast: Secondary | ICD-10-CM

## 2010-07-24 DIAGNOSIS — M810 Age-related osteoporosis without current pathological fracture: Secondary | ICD-10-CM

## 2010-07-24 LAB — CBC WITH DIFFERENTIAL/PLATELET
BASO%: 0.5 % (ref 0.0–2.0)
Basophils Absolute: 0 10*3/uL (ref 0.0–0.1)
LYMPH%: 27.6 % (ref 14.0–49.7)
MCH: 31.9 pg (ref 25.1–34.0)
MCHC: 34.8 g/dL (ref 31.5–36.0)
MCV: 91.8 fL (ref 79.5–101.0)
MONO#: 0.5 10*3/uL (ref 0.1–0.9)
MONO%: 8.6 % (ref 0.0–14.0)
NEUT#: 3.2 10*3/uL (ref 1.5–6.5)
NEUT%: 61.4 % (ref 38.4–76.8)
Platelets: 198 10*3/uL (ref 145–400)
RBC: 4.11 10*6/uL (ref 3.70–5.45)
RDW: 13.4 % (ref 11.2–14.5)

## 2010-07-24 LAB — COMPREHENSIVE METABOLIC PANEL
AST: 23 U/L (ref 0–37)
Albumin: 4.6 g/dL (ref 3.5–5.2)
CO2: 27 mEq/L (ref 19–32)
Calcium: 10 mg/dL (ref 8.4–10.5)
Creatinine, Ser: 0.65 mg/dL (ref 0.40–1.20)
Potassium: 4.4 mEq/L (ref 3.5–5.3)
Total Bilirubin: 0.6 mg/dL (ref 0.3–1.2)

## 2010-10-03 ENCOUNTER — Other Ambulatory Visit: Payer: Self-pay | Admitting: Oncology

## 2010-10-03 DIAGNOSIS — Z9889 Other specified postprocedural states: Secondary | ICD-10-CM

## 2010-10-13 ENCOUNTER — Encounter (INDEPENDENT_AMBULATORY_CARE_PROVIDER_SITE_OTHER): Payer: Self-pay | Admitting: General Surgery

## 2010-10-17 NOTE — Op Note (Signed)
[name], Shaw             ACCOUNT NO.:  [id number]   MEDICAL RECORD NO.:  [id number]          PATIENT TYPE:  AMB   LOCATION:  SDS                          FACILITY:  [facility]   PHYSICIAN:  Sharlet Salina T. Hoxworth, M.D.DATE OF BIRTH:  1945/11/07   DATE OF PROCEDURE:  DATE OF DISCHARGE:  01/09/2008                               OPERATIVE REPORT   PREOPERATIVE DIAGNOSIS:  Carcinoma of the right breast.   POSTOPERATIVE DIAGNOSIS:  Carcinoma of the right breast.   SURGICAL PROCEDURES:  1. Blue dye injection right breast.  2. Right axillary sentinel lymph node biopsy.  3. Needle-localized right breast lumpectomy.   SURGEON:  Lorne Skeens. Hoxworth, MD   ANESTHESIA:  Laryngeal mask general.   BRIEF HISTORY:  Kathleen Shaw is a 65 year old female who on a recent  routine screening mammogram was found to have an 8 mm mass in the upper  right breast that is seen on ultrasound.  Large core needle biopsy has  revealed an invasive ductal carcinoma.  After discussion of initial  surgical treatment options, we elected to proceed with right axillary  sentinel lymph node biopsy with possible axillary dissection and needle-  localized lumpectomy.  The nature of the procedure, indications, risks  of bleeding, infection, anesthetic complications, possible need for  further surgery based on final pathology findings have been discussed  and understood.  She is now brought to the operating room for this  procedure.  She is clinically stage [id number].   DESCRIPTION OF OPERATION:  Following successful needle localization and  injection of 1 millicurie of technetium sulfur colloid circumareolar  about 1 hour prior to the procedure, the patient was taken to the  operating room and placed in supine position on the operating table.  A  laryngeal mask general anesthesia was induced.  The right arm was  carefully positioned and extended on an arm board.  Under sterile  technique, 2 mL of methylene blue  and 3 mL of saline was injected  subcutaneously behind the right nipple and massaged.  The entire right  chest, axilla and upper arm were then widely sterilely prepped and  draped.  She received IV antibiotics.  Correct patient and procedure  were verified.  A hot spot was localized in the right axilla and a small  transverse incision made over this.  Dissection was carried down through  the subcutaneous tissue using cautery and blunt dissection was carefully  carried down through the clavipectoral fascia to the axilla.  Using the  NeoProbe for guidance, I came down immediately upon a bright blue hot  lymph node.  This was bluntly exposed and then completely excised using  cautery.  Ex vivo the node had counts of about 1900 with background in  the axilla of less than 30.  This was sent as a hot blue sentinel lymph  node.  While awaiting this report, I proceeded with the lumpectomy.  A  curvilinear circumareolar incision in the upper breast below the wire  insertion site was used and dissection carried down into the deep  subcutaneous tissue.  The wire was then brought into the  incision.  Using cautery, a generous specimen of breast tissue was then excised  around the shaft and tip of the wire.  Hemostasis was obtained with  cautery.  This was sent for specimen mammography.  The sentinel lymph  node report returned as touch prep negative for malignant cells.  Specimen mammography showed the lesion to be in the center of the  specimen.  The specimen had additionally been carefully inked for  margins in the operating room.  Following this, the soft tissue was  infiltrated  with Marcaine.  Both wounds were thoroughly irrigated and complete  hemostasis assured.  The subcu was closed with interrupted 3-0 Vicryl  and the skin with subcutaneous 4-0 Monocryl and Dermabond.  Sponge,  needle, and instrument counts were correct.  The patient was taken to  recovery in good condition.       Lorne Skeens. Hoxworth, M.D.  Electronically Signed     BTH/MEDQ  D:  01/09/2008  T:  01/10/2008  Job:  [id number]

## 2010-12-04 ENCOUNTER — Ambulatory Visit
Admission: RE | Admit: 2010-12-04 | Discharge: 2010-12-04 | Disposition: A | Discharge status: Home / Self Care | Payer: BC Managed Care – PPO | Source: Ambulatory Visit | Attending: Oncology | Admitting: Oncology

## 2010-12-04 DIAGNOSIS — Z9889 Other specified postprocedural states: Secondary | ICD-10-CM

## 2010-12-18 ENCOUNTER — Other Ambulatory Visit: Payer: Self-pay | Admitting: Oncology

## 2010-12-18 ENCOUNTER — Encounter (HOSPITAL_BASED_OUTPATIENT_CLINIC_OR_DEPARTMENT_OTHER): Payer: BC Managed Care – PPO | Admitting: Oncology

## 2010-12-18 DIAGNOSIS — C50919 Malignant neoplasm of unspecified site of unspecified female breast: Secondary | ICD-10-CM

## 2010-12-18 DIAGNOSIS — M810 Age-related osteoporosis without current pathological fracture: Secondary | ICD-10-CM

## 2010-12-18 LAB — COMPREHENSIVE METABOLIC PANEL
ALT: 14 U/L (ref 0–35)
Albumin: 4.4 g/dL (ref 3.5–5.2)
Alkaline Phosphatase: 60 U/L (ref 39–117)
Glucose, Bld: 85 mg/dL (ref 70–99)
Potassium: 4.2 mEq/L (ref 3.5–5.3)
Sodium: 141 mEq/L (ref 135–145)
Total Protein: 6.5 g/dL (ref 6.0–8.3)

## 2010-12-18 LAB — CBC WITH DIFFERENTIAL/PLATELET
BASO%: 0.4 % (ref 0.0–2.0)
Eosinophils Absolute: 0.1 10*3/uL (ref 0.0–0.5)
MCV: 92.7 fL (ref 79.5–101.0)
MONO#: 0.4 10*3/uL (ref 0.1–0.9)
MONO%: 8.4 % (ref 0.0–14.0)
NEUT#: 2.8 10*3/uL (ref 1.5–6.5)
RBC: 4.02 10*6/uL (ref 3.70–5.45)
RDW: 12.7 % (ref 11.2–14.5)
WBC: 4.8 10*3/uL (ref 3.9–10.3)

## 2011-02-01 ENCOUNTER — Ambulatory Visit (INDEPENDENT_AMBULATORY_CARE_PROVIDER_SITE_OTHER): Payer: BC Managed Care – PPO | Admitting: General Surgery

## 2011-02-01 ENCOUNTER — Encounter (INDEPENDENT_AMBULATORY_CARE_PROVIDER_SITE_OTHER): Payer: Self-pay

## 2011-02-01 VITALS — BP 138/84 | HR 80

## 2011-02-01 DIAGNOSIS — C50919 Malignant neoplasm of unspecified site of unspecified female breast: Secondary | ICD-10-CM | POA: Insufficient documentation

## 2011-02-01 NOTE — Progress Notes (Signed)
History:Patient returns 3 years following right breast lumpectomy and negative sentinel lymph node biopsy for T1 C. N0 carcinoma of the right breast. She underwent adjuvant chemotherapy with a.c. and Taxol and breast radiation. She reports no problems since her last visit. Specifically no breast lump, skin change, pain, or nipple discharge.  Exam: She generally appears well. There are no palpable cervical supraclavicular or axillary lymph nodes. Lungs are clear and equal bilaterally. Breast exam reveals a well-healed lumpectomy site in the upper right breast with no thickening or mass. No skin changes, nipple inversion, or any other palpable abnormalities in either breast.  Mammogram in July 2012 showed postbiopsy changes and otherwise negative  Assessment and plan: Doing well 3 years post treatment for T1 C. N0 triple negative carcinoma of the right breast with no clinical evidence of recurrence. She is to return in 6 months.

## 2011-02-01 NOTE — Patient Instructions (Signed)
Please call as needed for problems or questions

## 2011-03-02 LAB — DIFFERENTIAL
Basophils Relative: 0
Monocytes Absolute: 0.5
Monocytes Relative: 6
Neutro Abs: 4.7

## 2011-03-02 LAB — COMPREHENSIVE METABOLIC PANEL
Albumin: 4.2
Alkaline Phosphatase: 64
BUN: 9
Potassium: 4.6
Sodium: 137
Total Protein: 7

## 2011-03-02 LAB — CANCER ANTIGEN 27.29: CA 27.29: 16

## 2011-03-02 LAB — URINALYSIS, ROUTINE W REFLEX MICROSCOPIC
Bilirubin Urine: NEGATIVE
Glucose, UA: NEGATIVE
Hgb urine dipstick: NEGATIVE
Specific Gravity, Urine: 1.007
pH: 7

## 2011-03-02 LAB — CBC
HCT: 40
Platelets: 219
RDW: 12.9

## 2011-05-04 ENCOUNTER — Encounter: Payer: Self-pay | Admitting: *Deleted

## 2011-05-07 ENCOUNTER — Encounter: Payer: Self-pay | Admitting: Oncology

## 2011-05-07 DIAGNOSIS — M810 Age-related osteoporosis without current pathological fracture: Secondary | ICD-10-CM | POA: Insufficient documentation

## 2011-05-16 ENCOUNTER — Other Ambulatory Visit: Payer: Self-pay | Admitting: Oncology

## 2011-05-16 ENCOUNTER — Ambulatory Visit (HOSPITAL_BASED_OUTPATIENT_CLINIC_OR_DEPARTMENT_OTHER): Payer: BC Managed Care – PPO | Admitting: Oncology

## 2011-05-16 ENCOUNTER — Other Ambulatory Visit (HOSPITAL_BASED_OUTPATIENT_CLINIC_OR_DEPARTMENT_OTHER): Payer: BC Managed Care – PPO | Admitting: Lab

## 2011-05-16 ENCOUNTER — Telehealth: Payer: Self-pay | Admitting: Oncology

## 2011-05-16 DIAGNOSIS — M810 Age-related osteoporosis without current pathological fracture: Secondary | ICD-10-CM

## 2011-05-16 DIAGNOSIS — C50919 Malignant neoplasm of unspecified site of unspecified female breast: Secondary | ICD-10-CM

## 2011-05-16 DIAGNOSIS — Z853 Personal history of malignant neoplasm of breast: Secondary | ICD-10-CM

## 2011-05-16 LAB — COMPREHENSIVE METABOLIC PANEL
ALT: 13 U/L (ref 0–35)
AST: 20 U/L (ref 0–37)
Albumin: 4.4 g/dL (ref 3.5–5.2)
Alkaline Phosphatase: 64 U/L (ref 39–117)
Calcium: 9.9 mg/dL (ref 8.4–10.5)
Chloride: 104 mEq/L (ref 96–112)
Potassium: 4.1 mEq/L (ref 3.5–5.3)

## 2011-05-16 LAB — CBC WITH DIFFERENTIAL/PLATELET
BASO%: 0.2 % (ref 0.0–2.0)
EOS%: 1.6 % (ref 0.0–7.0)
HGB: 12.7 g/dL (ref 11.6–15.9)
MCH: 30.4 pg (ref 25.1–34.0)
MCHC: 33.8 g/dL (ref 31.5–36.0)
RBC: 4.18 10*6/uL (ref 3.70–5.45)
RDW: 13.1 % (ref 11.2–14.5)
lymph#: 1.8 10*3/uL (ref 0.9–3.3)

## 2011-05-16 NOTE — Telephone Encounter (Signed)
gve the pt her may 2013 appt calendar 

## 2011-05-16 NOTE — Progress Notes (Signed)
[facility] OFFICE PROGRESS NOTE  Gaspar Garbe, MD, MD  DIAGNOSIS:  pT1cN0(i+)M0 invasive breast cancer, triple negative, status post lumpectomy, status post dose-dense adjuvant Adriamycin/Cytoxan followed by Taxotere.  She finished adjuvant chemotherapy on 05/19/2008.  She underwent adjuvant radiation therapy between 06/22/2008 through 07/20/2008.  CURRENT THERAPY:  watchful observation.    INTERVAL HISTORY: Kathleen Shaw 65 y.o. female returns for regular follow up.  She performs self breast exam and has not found any abnormal breast lesion. She has mild fatigue however she is independent of all activities daily living. In fact she drove her husband down to [place] which was a 12 hour trip last month.  Patient denies fatigue, headache, visual changes, confusion, drenching night sweats, palpable lymph node swelling, mucositis, odynophagia, dysphagia, nausea vomiting, jaundice, chest pain, palpitation, shortness of breath, dyspnea on exertion, productive cough, gum bleeding, epistaxis, hematemesis, hemoptysis, abdominal pain, abdominal swelling, early satiety, melena, hematochezia, hematuria, skin rash, spontaneous bleeding, joint swelling, joint pain, heat or cold intolerance, bowel bladder incontinence, back pain, focal motor weakness, paresthesia, depression, suicidal or homocidal ideation, feeling hopelessness.   MEDICAL HISTORY: Past Medical History  Diagnosis Date  . Breast cancer 2009    right  . Osteoporosis     SURGICAL HISTORY:  Past Surgical History  Procedure Date  . Breast lumpectomy 2009    lumpectomy- right  . Bunionectomy 1985    right    MEDICATIONS: Current Outpatient Prescriptions  Medication Sig Dispense Refill  . CALCIUM-VITAMIN D PO Take 1,200 mg by mouth daily. 1200mg / 2000 iu      . Coenzyme Q10 (CO Q 10) 100 MG CAPS Take 1 tablet by mouth daily.        . fluocinonide cream (LIDEX) 0.05 % Apply 1 application topically 2 (two) times  daily.        . Multiple Vitamin (MULTIVITAMIN PO) Take 1 tablet by mouth daily.         ALLERGIES:  is allergic to penicillins.  REVIEW OF SYSTEMS:  The rest of the 14-point review of system was negative.   Filed Vitals:   05/16/11 1441  BP: 142/85  Pulse: 87  Temp: 97.2 F (36.2 C)   Wt Readings from Last 3 Encounters:  05/16/11 141 lb 4.8 oz (64.093 kg)  12/18/10 144 lb 6.4 oz (65.499 kg)   ECOG Performance status: 0  PHYSICAL EXAMINATION:  General:  well-nourished in no acute distress.  Eyes:  no scleral icterus.  ENT:  There were no oropharyngeal lesions.  Neck was without thyromegaly.  Lymphatics:  Negative cervical, supraclavicular or axillary adenopathy.  Respiratory: lungs were clear bilaterally without wheezing or crackles.  Cardiovascular:  Regular rate and rhythm, S1/S2, without murmur, rub or gallop.  There was no pedal edema.  GI:  abdomen was soft, flat, nontender, nondistended, without organomegaly.  Muscoloskeletal:  no spinal tenderness of palpation of vertebral spine.  Skin exam was without echymosis, petichae.  Neuro exam was nonfocal.  Patient was able to get on and off exam table without assistance.  Gait was normal.  Patient was alerted and oriented.  Attention was good.   Language was appropriate.  Mood was normal without depression.  Speech was not pressured.  Thought content was not tangential.  Bilateral breast exam was negative.    LABORATORY/RADIOLOGY DATA:  Lab Results  Component Value Date   WBC 6.4 05/16/2011   HGB 12.7 05/16/2011   HCT 37.6 05/16/2011   PLT 154 05/16/2011   GLUCOSE 85  12/18/2010   ALT 14 12/18/2010   AST 24 12/18/2010   NA 141 12/18/2010   K 4.2 12/18/2010   CL 106 12/18/2010   CREATININE 0.66 12/18/2010   BUN 13 12/18/2010   CO2 27 12/18/2010    ASSESSMENT AND PLAN:   1. History of triple-negative breast cancer:  I discussed with Kathleen Shaw that she has no evidence of recurrent metastatic disease, including on today's  clinical history, physical examination, laboratory tests, and imaging modalities.  2. Surveillance:  She is due for bilateral mammogram in July 2013.    3. Left hip pain:  Resolved.  Not a major issue for her now.  4. Age appropriate cancer screeing:  She is due for surveillance colonoscopy in 2013 or 2014.  Both her father and grandmother had colon cancer.  I encouraged her not to delay her colonoscopy when scheduled by GI.  Her Pap smear in November 2011 was reportedly negative.  5. Follow up with me in 5 months.

## 2011-08-17 ENCOUNTER — Ambulatory Visit (INDEPENDENT_AMBULATORY_CARE_PROVIDER_SITE_OTHER): Payer: BC Managed Care – PPO | Admitting: General Surgery

## 2011-08-17 ENCOUNTER — Encounter (INDEPENDENT_AMBULATORY_CARE_PROVIDER_SITE_OTHER): Payer: Self-pay | Admitting: General Surgery

## 2011-08-17 VITALS — BP 149/82 | HR 119 | Temp 97.8°F | Ht 63.0 in | Wt 143.0 lb

## 2011-08-17 DIAGNOSIS — C50919 Malignant neoplasm of unspecified site of unspecified female breast: Secondary | ICD-10-CM

## 2011-08-17 NOTE — Progress Notes (Signed)
Chief complaint: Followup breast cancer  History: The patient returns for long-term followup of her cancer the right breast, T1 C. N0 triple negative status post lumpectomy and sentinel lymph node biopsy, adjuvant chemotherapy and radiation. She generally has been getting along well. She has noted an occasional brief twinge of discomfort in the medial aspect of her right breast just within the last couple of weeks. She denies mass, skin changes, nipple discharge or arm swelling or pain.  Exam: Gen.: Well-appearing in no distress Skin: Pressure infection HEENT: No palpable masses or thyromegaly. Lymph nodes: No palpable cervical, supraclavicular or axillary lymph nodes Breasts: Well-healed lumpectomy site mid right breast. There are no palpable masses or any thickening or abnormality at the lumpectomy site. No skin changes. Again no palpable axillary adenopathy.  Imaging. Mammogram in July was negative  Assessment and plan: Clinically doing well has noticed recurrent disease. She has had minimal discomfort over a short period of time. I told her if this persists or worsens to call me we could consider a directed ultrasound but her exam is entirely negative. She is to have a mammogram in July and return in 6 months

## 2011-09-12 DIAGNOSIS — H524 Presbyopia: Secondary | ICD-10-CM | POA: Diagnosis not present

## 2011-10-12 ENCOUNTER — Telehealth: Payer: Self-pay | Admitting: Oncology

## 2011-10-12 ENCOUNTER — Ambulatory Visit (HOSPITAL_BASED_OUTPATIENT_CLINIC_OR_DEPARTMENT_OTHER): Payer: Medicare Other | Admitting: Oncology

## 2011-10-12 ENCOUNTER — Other Ambulatory Visit (HOSPITAL_BASED_OUTPATIENT_CLINIC_OR_DEPARTMENT_OTHER): Payer: Medicare Other | Admitting: Lab

## 2011-10-12 VITALS — BP 118/67 | HR 87 | Temp 97.0°F | Ht 63.0 in | Wt 142.9 lb

## 2011-10-12 DIAGNOSIS — C50919 Malignant neoplasm of unspecified site of unspecified female breast: Secondary | ICD-10-CM

## 2011-10-12 DIAGNOSIS — C50419 Malignant neoplasm of upper-outer quadrant of unspecified female breast: Secondary | ICD-10-CM

## 2011-10-12 DIAGNOSIS — M810 Age-related osteoporosis without current pathological fracture: Secondary | ICD-10-CM | POA: Diagnosis not present

## 2011-10-12 DIAGNOSIS — Z853 Personal history of malignant neoplasm of breast: Secondary | ICD-10-CM

## 2011-10-12 LAB — COMPREHENSIVE METABOLIC PANEL
ALT: 13 U/L (ref 0–35)
CO2: 28 mEq/L (ref 19–32)
Calcium: 9.4 mg/dL (ref 8.4–10.5)
Chloride: 106 mEq/L (ref 96–112)
Creatinine, Ser: 0.63 mg/dL (ref 0.50–1.10)
Glucose, Bld: 85 mg/dL (ref 70–99)
Total Bilirubin: 0.5 mg/dL (ref 0.3–1.2)

## 2011-10-12 LAB — CBC WITH DIFFERENTIAL/PLATELET
Basophils Absolute: 0 10*3/uL (ref 0.0–0.1)
Eosinophils Absolute: 0.1 10*3/uL (ref 0.0–0.5)
HGB: 12.8 g/dL (ref 11.6–15.9)
LYMPH%: 29.2 % (ref 14.0–49.7)
MONO#: 0.3 10*3/uL (ref 0.1–0.9)
Platelets: 151 10*3/uL (ref 145–400)
RBC: 4.11 10*6/uL (ref 3.70–5.45)

## 2011-10-12 NOTE — Progress Notes (Signed)
[facility]  Telephone:([phone] Fax:([phone]   OFFICE PROGRESS NOTE   Cc:  Gaspar Garbe, MD, MD   DIAGNOSIS AND PAST THERAPY: pT1cN0(i+)M0 invasive breast cancer, triple negative, status post lumpectomy, status post dose-dense adjuvant Adriamycin/Cytoxan followed by Taxotere. She finished adjuvant chemotherapy on 05/19/2008. She underwent adjuvant radiation therapy between 06/22/2008 through 07/20/2008.   CURRENT THERAPY: watchful observation.    INTERVAL HISTORY: SARAGRACE SELKE 66 y.o. female returns for regular follow up by herself.  She reports feeling well.  She takes care of her disable husband.  She denies fatigue, breast abnormality, SOB, CP, PND, orthopnea, bleeding symptoms, skin rash, back pain.  Her left hip pain has significantly improved compared to last visit.    Patient denies fatigue, headache, visual changes, confusion, drenching night sweats, palpable lymph node swelling, mucositis, odynophagia, dysphagia, nausea vomiting, jaundice, chest pain, palpitation, shortness of breath, dyspnea on exertion, productive cough, gum bleeding, epistaxis, hematemesis, hemoptysis, abdominal pain, abdominal swelling, early satiety, melena, hematochezia, hematuria, skin rash, spontaneous bleeding, joint swelling, joint pain, heat or cold intolerance, bowel bladder incontinence, back pain, focal motor weakness, paresthesia, depression, suicidal or homocidal ideation, feeling hopelessness.   Past Medical History  Diagnosis Date  . Breast cancer 2009    right  . Osteoporosis     Past Surgical History  Procedure Date  . Breast lumpectomy 2009    lumpectomy- right  . Bunionectomy 1985    right    Current Outpatient Prescriptions  Medication Sig Dispense Refill  . CALCIUM-VITAMIN D PO Take 1,200 mg by mouth daily. 1200mg / 2000 iu      . Coenzyme Q10 (CO Q 10) 100 MG CAPS Take 1 tablet by mouth daily.        . Multiple Vitamin (MULTIVITAMIN PO)  Take 1 tablet by mouth daily.       . fluocinonide cream (LIDEX) 0.05 % Apply 1 application topically 2 (two) times daily.          ALLERGIES:  is allergic to penicillins.  REVIEW OF SYSTEMS:  The rest of the 14-point review of system was negative.   Filed Vitals:   10/12/11 1014  BP: 118/67  Pulse: 87  Temp: 97 F (36.1 C)   Wt Readings from Last 3 Encounters:  10/12/11 142 lb 14.4 oz (64.819 kg)  08/17/11 143 lb (64.864 kg)  05/16/11 141 lb 4.8 oz (64.093 kg)   ECOG Performance status: 0  PHYSICAL EXAMINATION:   General:  Thin-appearing woman, in no acute distress.  Eyes:  no scleral icterus.  ENT:  There were no oropharyngeal lesions.  Neck was without thyromegaly.  Lymphatics:  Negative cervical, supraclavicular or axillary adenopathy.  Respiratory: lungs were clear bilaterally without wheezing or crackles.  Cardiovascular:  Regular rate and rhythm, S1/S2, without murmur, rub or gallop.  There was no pedal edema.  GI:  abdomen was soft, flat, nontender, nondistended, without organomegaly.  Muscoloskeletal:  no spinal tenderness of palpation of vertebral spine.  Skin exam was without echymosis, petichae.  Neuro exam was nonfocal.  Patient was able to get on and off exam table without assistance.  Gait was normal.  Patient was alerted and oriented.  Attention was good.   Language was appropriate.  Mood was normal without depression.  Speech was not pressured.  Thought content was not tangential.  Bilateral breast exam showed right upper inner quadrant lumpectomy scar that was well healed.  Otherwise, bilateral breast exam was negative for palpable breast mass, skin  thickening, erythema.     LABORATORY/RADIOLOGY DATA:  Lab Results  Component Value Date   WBC 5.2 10/12/2011   HGB 12.8 10/12/2011   HCT 37.0 10/12/2011   PLT 151 10/12/2011   GLUCOSE 106* 05/16/2011   ALKPHOS 64 05/16/2011   ALT 13 05/16/2011   AST 20 05/16/2011   NA 141 05/16/2011   K 4.1 05/16/2011   CL 104  05/16/2011   CREATININE 0.62 05/16/2011   BUN 14 05/16/2011   CO2 28 05/16/2011     ASSESSMENT AND PLAN:   1. History of triple-negative breast cancer:  There was no evidence of local recurrence or metastatic disease on today's clinical history, physical examination, laboratory tests.  She has no residual side effects from chemo.  2. Surveillance: She is due for bilateral mammogram in July 2013.  I ordered this today.  3. Left hip pain: Resolved. Not a major issue for her now.  4. Age appropriate cancer screeing: She is due for surveillance colonoscopy in 2013 or 2014. Both her father and grandmother had colon cancer. I encouraged her not to delay her colonoscopy when scheduled by GI. Her Pap smear in November 2011 was reportedly negative.  5. Osteoporosis:  She is on Calcium.  She is planning to have repeat DEXA scan with her PCP this year.  6. Follow up with me in 6 months.     The length of time of the face-to-face encounter was 15 minutes. More than 50% of time was spent counseling and coordination of care.

## 2011-10-12 NOTE — Telephone Encounter (Signed)
appts mad and printed for pt aom 

## 2011-12-10 ENCOUNTER — Ambulatory Visit
Admission: RE | Admit: 2011-12-10 | Discharge: 2011-12-10 | Disposition: A | Discharge status: Home / Self Care | Payer: Medicare Other | Source: Ambulatory Visit | Attending: Oncology | Admitting: Oncology

## 2011-12-10 DIAGNOSIS — Z853 Personal history of malignant neoplasm of breast: Secondary | ICD-10-CM | POA: Diagnosis not present

## 2011-12-10 DIAGNOSIS — C50919 Malignant neoplasm of unspecified site of unspecified female breast: Secondary | ICD-10-CM

## 2011-12-11 ENCOUNTER — Encounter (INDEPENDENT_AMBULATORY_CARE_PROVIDER_SITE_OTHER): Payer: Self-pay | Admitting: General Surgery

## 2012-02-15 ENCOUNTER — Ambulatory Visit (INDEPENDENT_AMBULATORY_CARE_PROVIDER_SITE_OTHER): Payer: Medicare Other | Admitting: General Surgery

## 2012-02-15 ENCOUNTER — Encounter (INDEPENDENT_AMBULATORY_CARE_PROVIDER_SITE_OTHER): Payer: Self-pay | Admitting: General Surgery

## 2012-02-15 VITALS — BP 136/86 | HR 86 | Temp 97.6°F | Ht 63.0 in | Wt 145.0 lb

## 2012-02-15 DIAGNOSIS — C50919 Malignant neoplasm of unspecified site of unspecified female breast: Secondary | ICD-10-CM

## 2012-02-15 NOTE — Progress Notes (Signed)
Chief complaint: Followup breast cancer History: Patient returns for routine followup in with history of T1 N0 triple negative cancer of the right breast status post lumpectomy and negative sentinel lymph node biopsy radiation and chemotherapy with diagnosis in August of 2009. She reports no problems since her last visit. Specifically denies breast pain or lumps, nipple discharge, skin changes or unusual pain. No arm swelling.  Exam: Gen.: Well-appearing female Lymph nodes: No cervical, supraclavicular or axillary nodes palpable Lungs: Clear equal breath sounds bilaterally Breasts: Well-healed lumpectomy upper inner right breast. No palpable masses in either breast. No skin or nipple changes.  Imaging mammogram July 2013 negative  Assessment and plan: Doing well with no clinical evidence of recurrence or new breast cancer at 4 years post treatment. Return in 6 months.

## 2012-03-04 DIAGNOSIS — Z23 Encounter for immunization: Secondary | ICD-10-CM | POA: Diagnosis not present

## 2012-04-15 ENCOUNTER — Encounter: Payer: Self-pay | Admitting: Oncology

## 2012-04-15 ENCOUNTER — Ambulatory Visit (HOSPITAL_BASED_OUTPATIENT_CLINIC_OR_DEPARTMENT_OTHER): Payer: Medicare Other | Admitting: Oncology

## 2012-04-15 ENCOUNTER — Telehealth: Payer: Self-pay | Admitting: Oncology

## 2012-04-15 ENCOUNTER — Other Ambulatory Visit (HOSPITAL_BASED_OUTPATIENT_CLINIC_OR_DEPARTMENT_OTHER): Payer: Medicare Other | Admitting: Lab

## 2012-04-15 VITALS — BP 133/77 | HR 91 | Temp 97.3°F | Resp 20 | Ht 63.0 in | Wt 139.7 lb

## 2012-04-15 DIAGNOSIS — C50919 Malignant neoplasm of unspecified site of unspecified female breast: Secondary | ICD-10-CM | POA: Diagnosis not present

## 2012-04-15 DIAGNOSIS — M810 Age-related osteoporosis without current pathological fracture: Secondary | ICD-10-CM

## 2012-04-15 DIAGNOSIS — M25559 Pain in unspecified hip: Secondary | ICD-10-CM | POA: Diagnosis not present

## 2012-04-15 DIAGNOSIS — Z853 Personal history of malignant neoplasm of breast: Secondary | ICD-10-CM

## 2012-04-15 LAB — CBC WITH DIFFERENTIAL/PLATELET
BASO%: 0.4 % (ref 0.0–2.0)
Eosinophils Absolute: 0.1 10*3/uL (ref 0.0–0.5)
HCT: 38 % (ref 34.8–46.6)
MCHC: 34.2 g/dL (ref 31.5–36.0)
MONO#: 0.5 10*3/uL (ref 0.1–0.9)
NEUT#: 3.5 10*3/uL (ref 1.5–6.5)
Platelets: 148 10*3/uL (ref 145–400)
RBC: 4.21 10*6/uL (ref 3.70–5.45)
WBC: 5.6 10*3/uL (ref 3.9–10.3)
lymph#: 1.5 10*3/uL (ref 0.9–3.3)

## 2012-04-15 LAB — COMPREHENSIVE METABOLIC PANEL (CC13)
ALT: 17 U/L (ref 0–55)
Albumin: 3.9 g/dL (ref 3.5–5.0)
CO2: 26 mEq/L (ref 22–29)
Calcium: 10 mg/dL (ref 8.4–10.4)
Chloride: 107 mEq/L (ref 98–107)
Glucose: 93 mg/dl (ref 70–99)
Sodium: 140 mEq/L (ref 136–145)
Total Protein: 6.6 g/dL (ref 6.4–8.3)

## 2012-04-15 NOTE — Progress Notes (Signed)
[facility]  Telephone:([phone] Fax:(336) [phone]   OFFICE PROGRESS NOTE   Cc:  Gaspar Garbe, MD   DIAGNOSIS AND PAST THERAPY: pT1cN0(i+)M0 invasive breast cancer, triple negative, status post lumpectomy, status post dose-dense adjuvant Adriamycin/Cytoxan followed by Taxotere. She finished adjuvant chemotherapy on 05/19/2008. She underwent adjuvant radiation therapy between 06/22/2008 through 07/20/2008.   CURRENT THERAPY: watchful observation.    INTERVAL HISTORY: Kathleen Shaw 66 y.o. female returns for regular follow up by herself.  She reports feeling well.  She takes care of her disable husband.  She denies fatigue, breast abnormality, SOB, CP, PND, orthopnea, bleeding symptoms, skin rash, back pain.  Her left hip pain has significantly improved compared to last visit.    Patient denies fatigue, headache, visual changes, confusion, drenching night sweats, palpable lymph node swelling, mucositis, odynophagia, dysphagia, nausea vomiting, jaundice, chest pain, palpitation, shortness of breath, dyspnea on exertion, productive cough, gum bleeding, epistaxis, hematemesis, hemoptysis, abdominal pain, abdominal swelling, early satiety, melena, hematochezia, hematuria, skin rash, spontaneous bleeding, joint swelling, joint pain, heat or cold intolerance, bowel bladder incontinence, back pain, focal motor weakness, paresthesia, depression, suicidal or homocidal ideation, feeling hopelessness.   Past Medical History  Diagnosis Date  . Breast cancer 2009    right  . Osteoporosis     Past Surgical History  Procedure Date  . Breast lumpectomy 2009    lumpectomy- right  . Bunionectomy 1985    right    Current Outpatient Prescriptions  Medication Sig Dispense Refill  . CALCIUM-VITAMIN D PO Take 1,200 mg by mouth daily. 1200mg / 2000 iu      . Multiple Vitamin (MULTIVITAMIN PO) Take 1 tablet by mouth daily.         ALLERGIES:  is allergic to  penicillins.  REVIEW OF SYSTEMS:  The rest of the 14-point review of system was negative.   Filed Vitals:   04/15/12 1039  BP: 133/77  Pulse: 91  Temp: 97.3 F (36.3 C)  Resp: 20   Wt Readings from Last 3 Encounters:  04/15/12 139 lb 11.2 oz (63.368 kg)  02/15/12 145 lb (65.772 kg)  10/12/11 142 lb 14.4 oz (64.819 kg)   ECOG Performance status: 0  PHYSICAL EXAMINATION:   General:  Thin-appearing woman, in no acute distress.  Eyes:  no scleral icterus.  ENT:  There were no oropharyngeal lesions.  Neck was without thyromegaly.  Lymphatics:  Negative cervical, supraclavicular or axillary adenopathy.  Respiratory: lungs were clear bilaterally without wheezing or crackles.  Cardiovascular:  Regular rate and rhythm, S1/S2, without murmur, rub or gallop.  There was no pedal edema.  GI:  abdomen was soft, flat, nontender, nondistended, without organomegaly.  Muscoloskeletal:  no spinal tenderness of palpation of vertebral spine.  Skin exam was without echymosis, petichae.  Neuro exam was nonfocal.  Patient was able to get on and off exam table without assistance.  Gait was normal.  Patient was alerted and oriented.  Attention was good.   Language was appropriate.  Mood was normal without depression.  Speech was not pressured.  Thought content was not tangential.  Bilateral breast exam showed right upper inner quadrant lumpectomy scar that was well healed.  Otherwise, bilateral breast exam was negative for palpable breast mass, skin thickening, erythema.     LABORATORY/RADIOLOGY DATA:  Lab Results  Component Value Date   WBC 5.6 04/15/2012   HGB 13.0 04/15/2012   HCT 38.0 04/15/2012   PLT 148 04/15/2012   GLUCOSE 93 04/15/2012  ALKPHOS 61 04/15/2012   ALT 17 04/15/2012   AST 21 04/15/2012   NA 140 04/15/2012   K 4.2 04/15/2012   CL 107 04/15/2012   CREATININE 0.7 04/15/2012   BUN 17.0 04/15/2012   CO2 26 04/15/2012     ASSESSMENT AND PLAN:   1. History of triple-negative breast  cancer:  There was no evidence of local recurrence or metastatic disease on today's clinical history, physical examination, laboratory tests.  She has no residual side effects from chemo.  2. Surveillance: Bilateral mammogram in July 2013 was nomral. Next mammogram is due July 2014.  3. Left hip pain: Resolved. Not a major issue for her now.  4. Age appropriate cancer screeing: She is due for surveillance colonoscopy in 2014. Both her father and grandmother had colon cancer. I encouraged her not to delay her colonoscopy when scheduled by GI. Her Pap smear in November 2011 was reportedly negative.  5. Osteoporosis:  She is on Calcium.  She is planning to have repeat DEXA scan with her PCP this year.  6. Follow up with in 6 months.     The length of time of the face-to-face encounter was 15 minutes. More than 50% of time was spent counseling and coordination of care.

## 2012-04-15 NOTE — Telephone Encounter (Signed)
appts made and printed for pt aom °

## 2012-04-28 DIAGNOSIS — C50919 Malignant neoplasm of unspecified site of unspecified female breast: Secondary | ICD-10-CM | POA: Diagnosis not present

## 2012-04-28 DIAGNOSIS — Z0000 Encounter for general adult medical examination without abnormal findings: Secondary | ICD-10-CM | POA: Diagnosis not present

## 2012-04-28 DIAGNOSIS — M949 Disorder of cartilage, unspecified: Secondary | ICD-10-CM | POA: Diagnosis not present

## 2012-04-28 DIAGNOSIS — M899 Disorder of bone, unspecified: Secondary | ICD-10-CM | POA: Diagnosis not present

## 2012-05-05 ENCOUNTER — Encounter: Payer: Self-pay | Admitting: Gastroenterology

## 2012-05-05 DIAGNOSIS — C50919 Malignant neoplasm of unspecified site of unspecified female breast: Secondary | ICD-10-CM | POA: Diagnosis not present

## 2012-05-05 DIAGNOSIS — Z0000 Encounter for general adult medical examination without abnormal findings: Secondary | ICD-10-CM | POA: Diagnosis not present

## 2012-05-05 DIAGNOSIS — Z124 Encounter for screening for malignant neoplasm of cervix: Secondary | ICD-10-CM | POA: Diagnosis not present

## 2012-05-05 DIAGNOSIS — M899 Disorder of bone, unspecified: Secondary | ICD-10-CM | POA: Diagnosis not present

## 2012-05-05 DIAGNOSIS — Z23 Encounter for immunization: Secondary | ICD-10-CM | POA: Diagnosis not present

## 2012-05-05 DIAGNOSIS — M949 Disorder of cartilage, unspecified: Secondary | ICD-10-CM | POA: Diagnosis not present

## 2012-05-08 DIAGNOSIS — Z1212 Encounter for screening for malignant neoplasm of rectum: Secondary | ICD-10-CM | POA: Diagnosis not present

## 2012-05-14 DIAGNOSIS — M949 Disorder of cartilage, unspecified: Secondary | ICD-10-CM | POA: Diagnosis not present

## 2012-05-14 DIAGNOSIS — M899 Disorder of bone, unspecified: Secondary | ICD-10-CM | POA: Diagnosis not present

## 2012-08-21 ENCOUNTER — Ambulatory Visit (INDEPENDENT_AMBULATORY_CARE_PROVIDER_SITE_OTHER): Payer: Medicare Other | Admitting: General Surgery

## 2012-10-13 ENCOUNTER — Telehealth: Payer: Self-pay | Admitting: Oncology

## 2012-10-13 ENCOUNTER — Other Ambulatory Visit (HOSPITAL_BASED_OUTPATIENT_CLINIC_OR_DEPARTMENT_OTHER): Payer: Medicare Other | Admitting: Lab

## 2012-10-13 ENCOUNTER — Ambulatory Visit (HOSPITAL_BASED_OUTPATIENT_CLINIC_OR_DEPARTMENT_OTHER): Payer: Medicare Other | Admitting: Oncology

## 2012-10-13 VITALS — BP 117/55 | HR 82 | Temp 97.6°F | Resp 18 | Ht 63.0 in | Wt 139.6 lb

## 2012-10-13 DIAGNOSIS — C50919 Malignant neoplasm of unspecified site of unspecified female breast: Secondary | ICD-10-CM

## 2012-10-13 LAB — COMPREHENSIVE METABOLIC PANEL (CC13)
Calcium: 9.6 mg/dL (ref 8.4–10.4)
Glucose: 104 mg/dl — ABNORMAL HIGH (ref 70–99)
Potassium: 4.2 mEq/L (ref 3.5–5.1)
Sodium: 142 mEq/L (ref 136–145)
Total Bilirubin: 0.51 mg/dL (ref 0.20–1.20)

## 2012-10-13 LAB — CBC WITH DIFFERENTIAL/PLATELET
BASO%: 0.7 % (ref 0.0–2.0)
Eosinophils Absolute: 0.1 10*3/uL (ref 0.0–0.5)
LYMPH%: 27.3 % (ref 14.0–49.7)
MCHC: 33.2 g/dL (ref 31.5–36.0)
MONO#: 0.4 10*3/uL (ref 0.1–0.9)
NEUT#: 3.3 10*3/uL (ref 1.5–6.5)
RBC: 4.17 10*6/uL (ref 3.70–5.45)
RDW: 13.4 % (ref 11.2–14.5)
WBC: 5.3 10*3/uL (ref 3.9–10.3)
lymph#: 1.5 10*3/uL (ref 0.9–3.3)

## 2012-10-13 NOTE — Telephone Encounter (Signed)
pt sched for mammo for July 9.14

## 2012-10-13 NOTE — Telephone Encounter (Signed)
gv and printed appt sched and avs for pt for July and May 2015...Marland Kitchenpt ok and aware

## 2012-10-13 NOTE — Progress Notes (Signed)
[facility]  [phone] [phone]   OFFICE PROGRESS NOTE   Cc:  Gaspar Garbe, MD   DIAGNOSIS AND PAST THERAPY: pT1cN0(i+)M0 invasive breast cancer, triple negative, status post lumpectomy, status post dose-dense adjuvant Adriamycin/Cytoxan followed by Taxotere. She finished adjuvant chemotherapy on 05/19/2008. She underwent adjuvant radiation therapy between 06/22/2008 through 07/20/2008.   CURRENT THERAPY: watchful observation.    INTERVAL HISTORY: Kathleen Shaw 67 y.o. female returns for regular follow up by herself.  She reports feeling well. Husband is still not doing well from his stroke. She herself feels well. She denies any problem. She denies any breast mass. Patient denies fever, anorexia, weight loss, fatigue, headache, visual changes, confusion, drenching night sweats, palpable lymph node swelling, mucositis, odynophagia, dysphagia, nausea vomiting, jaundice, chest pain, palpitation, shortness of breath, dyspnea on exertion, productive cough, gum bleeding, epistaxis, hematemesis, hemoptysis, abdominal pain, abdominal swelling, early satiety, melena, hematochezia, hematuria, skin rash, spontaneous bleeding, joint swelling, joint pain, heat or cold intolerance, bowel bladder incontinence, back pain, focal motor weakness, paresthesia, depression.     Past Medical History  Diagnosis Date  . Breast cancer 2009    right  . Osteoporosis     Past Surgical History  Procedure Laterality Date  . Breast lumpectomy  2009    lumpectomy- right  . Bunionectomy  1985    right    Current Outpatient Prescriptions  Medication Sig Dispense Refill  . CALCIUM-VITAMIN D PO Take 1,200 mg by mouth daily. 1200mg / 2000 iu      . Multiple Vitamin (MULTIVITAMIN PO) Take 1 tablet by mouth daily.        No current facility-administered medications for this visit.    ALLERGIES:  is allergic to penicillins.  REVIEW OF SYSTEMS:  The rest of  the 14-point review of system was negative.   Filed Vitals:   10/13/12 1012  BP: 117/55  Pulse: 82  Temp: 97.6 F (36.4 C)  Resp: 18   Wt Readings from Last 3 Encounters:  10/13/12 139 lb 9.6 oz (63.322 kg)  04/15/12 139 lb 11.2 oz (63.368 kg)  02/15/12 145 lb (65.772 kg)   ECOG Performance status: 0  PHYSICAL EXAMINATION:   General:  Thin-appearing woman, in no acute distress.  Eyes:  no scleral icterus.  ENT:  There were no oropharyngeal lesions.  Neck was without thyromegaly.  Lymphatics:  Negative cervical, supraclavicular or axillary adenopathy.  Respiratory: lungs were clear bilaterally without wheezing or crackles.  Cardiovascular:  Regular rate and rhythm, S1/S2, without murmur, rub or gallop.  There was no pedal edema.  GI:  abdomen was soft, flat, nontender, nondistended, without organomegaly.  Muscoloskeletal:  no spinal tenderness of palpation of vertebral spine.  Skin exam was without echymosis, petichae.  Neuro exam was nonfocal.  Patient was able to get on and off exam table without assistance.  Gait was normal.  Patient was alerted and oriented.  Attention was good.   Language was appropriate.  Mood was normal without depression.  Speech was not pressured.  Thought content was not tangential.  Bilateral breast exam showed right upper inner quadrant lumpectomy scar that was well healed.  Otherwise, bilateral breast exam was negative for palpable breast mass, skin thickening, erythema.     LABORATORY/RADIOLOGY DATA:  Lab Results  Component Value Date   WBC 5.3 10/13/2012   HGB 12.6 10/13/2012   HCT 38.1 10/13/2012   PLT 185 10/13/2012   GLUCOSE 104* 10/13/2012   ALKPHOS 60  10/13/2012   ALT 14 10/13/2012   AST 21 10/13/2012   NA 142 10/13/2012   K 4.2 10/13/2012   CL 106 10/13/2012   CREATININE 0.7 10/13/2012   BUN 15.5 10/13/2012   CO2 28 10/13/2012     ASSESSMENT AND PLAN:   1. History of triple-negative breast cancer:  Continues to be in remission. 2. Surveillance: She  is due for bilateral mammogram in July 2014.  I ordered this today.  3. Age appropriate cancer screeing: She is due for surveillance colonoscopy this year 2014. Both her father and grandmother had colon cancer. I encouraged her not to delay her colonoscopy when scheduled by GI. She reports that her Pap smear is up-to-date. It has always been negative. 4. Follow up with me in 12 months.   I informed the patient that I am leaving the practice. The cancer Center will arrange for her to followup with a new provider when she returns.  The length of time of the face-to-face encounter was 10 minutes. More than 50% of time was spent counseling and coordination of care.     Kathleen Shaw T. Gaylyn Rong, M.D.

## 2012-10-23 ENCOUNTER — Ambulatory Visit (INDEPENDENT_AMBULATORY_CARE_PROVIDER_SITE_OTHER): Payer: Medicare Other | Admitting: General Surgery

## 2012-12-10 ENCOUNTER — Ambulatory Visit
Admission: RE | Admit: 2012-12-10 | Discharge: 2012-12-10 | Disposition: A | Discharge status: Home / Self Care | Payer: Medicare Other | Source: Ambulatory Visit | Attending: Oncology | Admitting: Oncology

## 2012-12-10 DIAGNOSIS — C50919 Malignant neoplasm of unspecified site of unspecified female breast: Secondary | ICD-10-CM

## 2012-12-10 DIAGNOSIS — Z853 Personal history of malignant neoplasm of breast: Secondary | ICD-10-CM | POA: Diagnosis not present

## 2013-01-27 ENCOUNTER — Encounter: Payer: Self-pay | Admitting: Gastroenterology

## 2013-02-20 DIAGNOSIS — Z23 Encounter for immunization: Secondary | ICD-10-CM | POA: Diagnosis not present

## 2013-03-12 ENCOUNTER — Encounter: Payer: Self-pay | Admitting: Gastroenterology

## 2013-04-29 DIAGNOSIS — M899 Disorder of bone, unspecified: Secondary | ICD-10-CM | POA: Diagnosis not present

## 2013-04-29 DIAGNOSIS — Z0000 Encounter for general adult medical examination without abnormal findings: Secondary | ICD-10-CM | POA: Diagnosis not present

## 2013-05-07 ENCOUNTER — Ambulatory Visit (AMBULATORY_SURGERY_CENTER): Payer: Self-pay

## 2013-05-07 VITALS — Ht 63.0 in | Wt 139.0 lb

## 2013-05-07 DIAGNOSIS — C50919 Malignant neoplasm of unspecified site of unspecified female breast: Secondary | ICD-10-CM | POA: Diagnosis not present

## 2013-05-07 DIAGNOSIS — Z800 Family history of malignant neoplasm of digestive organs: Secondary | ICD-10-CM

## 2013-05-07 DIAGNOSIS — Z1331 Encounter for screening for depression: Secondary | ICD-10-CM | POA: Diagnosis not present

## 2013-05-07 DIAGNOSIS — Z23 Encounter for immunization: Secondary | ICD-10-CM | POA: Diagnosis not present

## 2013-05-07 DIAGNOSIS — M899 Disorder of bone, unspecified: Secondary | ICD-10-CM | POA: Diagnosis not present

## 2013-05-07 DIAGNOSIS — Z6825 Body mass index (BMI) 25.0-25.9, adult: Secondary | ICD-10-CM | POA: Diagnosis not present

## 2013-05-07 DIAGNOSIS — Z0000 Encounter for general adult medical examination without abnormal findings: Secondary | ICD-10-CM | POA: Diagnosis not present

## 2013-05-07 MED ORDER — MOVIPREP 100 G PO SOLR: 1.0000 | Freq: Once | ORAL | Status: DC

## 2013-05-21 ENCOUNTER — Encounter: Payer: Medicare Other | Admitting: Gastroenterology

## 2013-07-14 ENCOUNTER — Encounter: Payer: Self-pay | Admitting: Gastroenterology

## 2013-07-14 ENCOUNTER — Ambulatory Visit (AMBULATORY_SURGERY_CENTER): Payer: Medicare Other | Admitting: Gastroenterology

## 2013-07-14 VITALS — BP 132/70 | HR 92 | Temp 98.5°F | Resp 16 | Ht 63.0 in | Wt 139.0 lb

## 2013-07-14 DIAGNOSIS — Z800 Family history of malignant neoplasm of digestive organs: Secondary | ICD-10-CM | POA: Diagnosis not present

## 2013-07-14 DIAGNOSIS — Z1211 Encounter for screening for malignant neoplasm of colon: Secondary | ICD-10-CM | POA: Diagnosis not present

## 2013-07-14 MED ORDER — SODIUM CHLORIDE 0.9 % IV SOLN: 500.0000 mL | INTRAVENOUS | Status: DC

## 2013-07-14 NOTE — Op Note (Signed)
[place]  [address] [place], [address]   COLONOSCOPY PROCEDURE REPORT  PATIENT: Kathleen Shaw, Kathleen Shaw  MR#: [id number] BIRTHDATE: August 16, 1945 , 67  yrs. old GENDER: Female ENDOSCOPIST: Ladene Artist, MD, [facility] PROCEDURE DATE:  07/14/2013 PROCEDURE:   Colonoscopy, screening First Screening Colonoscopy - Avg.  risk and is 50 yrs.  old or older - No.  Prior Negative Screening - Now for repeat screening. Above average risk  History of Adenoma - Now for follow-up colonoscopy & has been > or = to 3 yrs.  N/A  Polyps Removed Today? No.  Recommend repeat exam, <10 yrs? Yes.  High risk (family or personal hx). ASA CLASS:   Class II INDICATIONS:elevated risk screening: patient's immediate family history of colon cancer, and  family history of colon cancer, distant relative. MEDICATIONS: MAC sedation, administered by CRNA and propofol (Diprivan) 300mg  IV DESCRIPTION OF PROCEDURE:   After the risks benefits and alternatives of the procedure were thoroughly explained, informed consent was obtained.  A digital rectal exam revealed no abnormalities of the rectum.   The LB [id number]  endoscope was introduced through the anus and advanced to the cecum, which was identified by both the appendix and ileocecal valve. No adverse events experienced.   The quality of the prep was excellent, using MoviPrep  The instrument was then slowly withdrawn as the colon was fully examined.  COLON FINDINGS: A normal appearing cecum, ileocecal valve, and appendiceal orifice were identified.  The ascending, hepatic flexure, transverse, splenic flexure, descending, sigmoid colon and rectum appeared unremarkable.  No polyps or cancers were seen. Retroflexed views revealed no abnormalities. The time to cecum=6 minutes 54 seconds.  Withdrawal time=10 minutes 46 seconds.  The scope was withdrawn and the procedure completed.  COMPLICATIONS: There were no complications.  ENDOSCOPIC  IMPRESSION: 1.  Normal colon  RECOMMENDATIONS: 1.  Repeat Colonoscopy in 5 years.  eSigned:  Ladene Artist, MD, [facility] 07/14/2013 11:18 AM   cc: Domenick Gong, MD

## 2013-07-14 NOTE — Patient Instructions (Signed)
YOU HAD AN ENDOSCOPIC PROCEDURE TODAY AT THE [facility]: Refer to the procedure report that was given to you for any specific questions about what was found during the examination.  If the procedure report does not answer your questions, please call your gastroenterologist to clarify.  If you requested that your care partner not be given the details of your procedure findings, then the procedure report has been included in a sealed envelope for you to review at your convenience later.  YOU SHOULD EXPECT: Some feelings of bloating in the abdomen. Passage of more gas than usual.  Walking can help get rid of the air that was put into your GI tract during the procedure and reduce the bloating. If you had a lower endoscopy (such as a colonoscopy or flexible sigmoidoscopy) you may notice spotting of blood in your stool or on the toilet paper. If you underwent a bowel prep for your procedure, then you may not have a normal bowel movement for a few days.  DIET: Your first meal following the procedure should be a light meal and then it is ok to progress to your normal diet.  A half-sandwich or bowl of soup is an example of a good first meal.  Heavy or fried foods are harder to digest and may make you feel nauseous or bloated.  Likewise meals heavy in dairy and vegetables can cause extra gas to form and this can also increase the bloating.  Drink plenty of fluids but you should avoid alcoholic beverages for 24 hours.  ACTIVITY: Your care partner should take you home directly after the procedure.  You should plan to take it easy, moving slowly for the rest of the day.  You can resume normal activity the day after the procedure however you should NOT DRIVE or use heavy machinery for 24 hours (because of the sedation medicines used during the test).    SYMPTOMS TO REPORT IMMEDIATELY: A gastroenterologist can be reached at any hour.  During normal business hours, 8:30 AM to 5:00 PM Monday through Friday,  call [phone].  After hours and on weekends, please call the GI answering service at [phone] who will take a message and have the physician on call contact you.   Following lower endoscopy (colonoscopy or flexible sigmoidoscopy):  Excessive amounts of blood in the stool  Significant tenderness or worsening of abdominal pains  Swelling of the abdomen that is new, acute  Fever of 100F or higher    FOLLOW UP: If any biopsies were taken you will be contacted by phone or by letter within the next 1-3 weeks.  Call your gastroenterologist if you have not heard about the biopsies in 3 weeks.  Our staff will call the home number listed on your records the next business day following your procedure to check on you and address any questions or concerns that you may have at that time regarding the information given to you following your procedure. This is a courtesy call and so if there is no answer at the home number and we have not heard from you through the emergency physician on call, we will assume that you have returned to your regular daily activities without incident.  SIGNATURES/CONFIDENTIALITY: You and/or your care partner have signed paperwork which will be entered into your electronic medical record.  These signatures attest to the fact that that the information above on your After Visit Summary has been reviewed and is understood.  Full responsibility of the confidentiality   of this discharge information lies with you and/or your care-partner.     

## 2013-07-15 ENCOUNTER — Telehealth: Payer: Self-pay | Admitting: *Deleted

## 2013-07-15 NOTE — Telephone Encounter (Signed)
  Follow up Call-  Call back number 07/14/2013  Post procedure Call Back phone  # [phone]  Permission to leave phone message Yes     Patient questions:  Do you have a fever, pain , or abdominal swelling? no Pain Score  0 *  Have you tolerated food without any problems? yes  Have you been able to return to your normal activities? yes  Do you have any questions about your discharge instructions: Diet   no Medications  no Follow up visit  no  Do you have questions or concerns about your Care? no  Actions: * If pain score is 4 or above: No action needed, pain <4.

## 2013-10-12 ENCOUNTER — Telehealth: Payer: Self-pay | Admitting: Hematology and Oncology

## 2013-10-12 NOTE — Telephone Encounter (Signed)
Pt called and cancelled appt for lab and MD for 5/13, nurse notified

## 2013-10-14 ENCOUNTER — Ambulatory Visit: Payer: Medicare Other | Admitting: Hematology and Oncology

## 2013-10-14 ENCOUNTER — Other Ambulatory Visit: Payer: Medicare Other

## 2013-10-29 ENCOUNTER — Other Ambulatory Visit (INDEPENDENT_AMBULATORY_CARE_PROVIDER_SITE_OTHER): Payer: Self-pay | Admitting: General Surgery

## 2013-10-29 DIAGNOSIS — Z1231 Encounter for screening mammogram for malignant neoplasm of breast: Secondary | ICD-10-CM

## 2013-10-29 DIAGNOSIS — Z853 Personal history of malignant neoplasm of breast: Secondary | ICD-10-CM

## 2013-12-14 ENCOUNTER — Ambulatory Visit
Admission: RE | Admit: 2013-12-14 | Discharge: 2013-12-14 | Disposition: A | Discharge status: Home / Self Care | Payer: Medicare Other | Source: Ambulatory Visit | Attending: General Surgery | Admitting: General Surgery

## 2013-12-14 DIAGNOSIS — Z853 Personal history of malignant neoplasm of breast: Secondary | ICD-10-CM | POA: Diagnosis not present

## 2013-12-14 DIAGNOSIS — R928 Other abnormal and inconclusive findings on diagnostic imaging of breast: Secondary | ICD-10-CM | POA: Diagnosis not present

## 2014-03-17 DIAGNOSIS — Z23 Encounter for immunization: Secondary | ICD-10-CM | POA: Diagnosis not present

## 2014-05-18 DIAGNOSIS — Z0000 Encounter for general adult medical examination without abnormal findings: Secondary | ICD-10-CM | POA: Diagnosis not present

## 2014-05-18 DIAGNOSIS — M8588 Other specified disorders of bone density and structure, other site: Secondary | ICD-10-CM | POA: Diagnosis not present

## 2014-05-18 DIAGNOSIS — M859 Disorder of bone density and structure, unspecified: Secondary | ICD-10-CM | POA: Diagnosis not present

## 2014-05-25 DIAGNOSIS — M8580 Other specified disorders of bone density and structure, unspecified site: Secondary | ICD-10-CM | POA: Diagnosis not present

## 2014-05-25 DIAGNOSIS — C50919 Malignant neoplasm of unspecified site of unspecified female breast: Secondary | ICD-10-CM | POA: Diagnosis not present

## 2014-05-25 DIAGNOSIS — Z6825 Body mass index (BMI) 25.0-25.9, adult: Secondary | ICD-10-CM | POA: Diagnosis not present

## 2014-05-25 DIAGNOSIS — Z0000 Encounter for general adult medical examination without abnormal findings: Secondary | ICD-10-CM | POA: Diagnosis not present

## 2014-11-10 ENCOUNTER — Other Ambulatory Visit: Payer: Self-pay | Admitting: Internal Medicine

## 2014-11-10 DIAGNOSIS — Z853 Personal history of malignant neoplasm of breast: Secondary | ICD-10-CM

## 2014-12-14 ENCOUNTER — Encounter: Payer: Self-pay | Admitting: Genetic Counselor

## 2014-12-22 ENCOUNTER — Ambulatory Visit
Admission: RE | Admit: 2014-12-22 | Discharge: 2014-12-22 | Disposition: A | Discharge status: Home / Self Care | Payer: Medicare Other | Source: Ambulatory Visit | Attending: Internal Medicine | Admitting: Internal Medicine

## 2014-12-22 DIAGNOSIS — Z853 Personal history of malignant neoplasm of breast: Secondary | ICD-10-CM | POA: Diagnosis not present

## 2014-12-22 DIAGNOSIS — R922 Inconclusive mammogram: Secondary | ICD-10-CM | POA: Diagnosis not present

## 2015-02-04 DIAGNOSIS — Z23 Encounter for immunization: Secondary | ICD-10-CM | POA: Diagnosis not present

## 2015-05-26 DIAGNOSIS — Z0000 Encounter for general adult medical examination without abnormal findings: Secondary | ICD-10-CM | POA: Diagnosis not present

## 2015-05-26 DIAGNOSIS — M859 Disorder of bone density and structure, unspecified: Secondary | ICD-10-CM | POA: Diagnosis not present

## 2015-06-02 DIAGNOSIS — Z1389 Encounter for screening for other disorder: Secondary | ICD-10-CM | POA: Diagnosis not present

## 2015-06-02 DIAGNOSIS — Z853 Personal history of malignant neoplasm of breast: Secondary | ICD-10-CM | POA: Diagnosis not present

## 2015-06-02 DIAGNOSIS — M859 Disorder of bone density and structure, unspecified: Secondary | ICD-10-CM | POA: Diagnosis not present

## 2015-06-02 DIAGNOSIS — Z0000 Encounter for general adult medical examination without abnormal findings: Secondary | ICD-10-CM | POA: Diagnosis not present

## 2015-06-02 DIAGNOSIS — Z6826 Body mass index (BMI) 26.0-26.9, adult: Secondary | ICD-10-CM | POA: Diagnosis not present

## 2015-07-01 DIAGNOSIS — Z1212 Encounter for screening for malignant neoplasm of rectum: Secondary | ICD-10-CM | POA: Diagnosis not present

## 2015-11-01 DIAGNOSIS — L0889 Other specified local infections of the skin and subcutaneous tissue: Secondary | ICD-10-CM | POA: Diagnosis not present

## 2015-11-01 DIAGNOSIS — Z6825 Body mass index (BMI) 25.0-25.9, adult: Secondary | ICD-10-CM | POA: Diagnosis not present

## 2015-11-28 ENCOUNTER — Other Ambulatory Visit: Payer: Self-pay | Admitting: Internal Medicine

## 2015-11-28 DIAGNOSIS — Z1231 Encounter for screening mammogram for malignant neoplasm of breast: Secondary | ICD-10-CM

## 2015-12-26 ENCOUNTER — Ambulatory Visit
Admission: RE | Admit: 2015-12-26 | Discharge: 2015-12-26 | Disposition: A | Discharge status: Home / Self Care | Payer: Medicare Other | Source: Ambulatory Visit | Attending: Internal Medicine | Admitting: Internal Medicine

## 2015-12-26 DIAGNOSIS — Z1231 Encounter for screening mammogram for malignant neoplasm of breast: Secondary | ICD-10-CM

## 2016-02-27 DIAGNOSIS — Z23 Encounter for immunization: Secondary | ICD-10-CM | POA: Diagnosis not present

## 2016-06-08 DIAGNOSIS — M859 Disorder of bone density and structure, unspecified: Secondary | ICD-10-CM | POA: Diagnosis not present

## 2016-06-08 DIAGNOSIS — Z0000 Encounter for general adult medical examination without abnormal findings: Secondary | ICD-10-CM | POA: Diagnosis not present

## 2016-06-15 DIAGNOSIS — Z1389 Encounter for screening for other disorder: Secondary | ICD-10-CM | POA: Diagnosis not present

## 2016-06-15 DIAGNOSIS — Z853 Personal history of malignant neoplasm of breast: Secondary | ICD-10-CM | POA: Diagnosis not present

## 2016-06-15 DIAGNOSIS — M810 Age-related osteoporosis without current pathological fracture: Secondary | ICD-10-CM | POA: Diagnosis not present

## 2016-06-15 DIAGNOSIS — Z6824 Body mass index (BMI) 24.0-24.9, adult: Secondary | ICD-10-CM | POA: Diagnosis not present

## 2016-06-15 DIAGNOSIS — Z0000 Encounter for general adult medical examination without abnormal findings: Secondary | ICD-10-CM | POA: Diagnosis not present

## 2016-11-20 ENCOUNTER — Other Ambulatory Visit: Payer: Self-pay | Admitting: Internal Medicine

## 2016-11-20 DIAGNOSIS — Z1231 Encounter for screening mammogram for malignant neoplasm of breast: Secondary | ICD-10-CM

## 2016-12-26 ENCOUNTER — Ambulatory Visit: Payer: Medicare Other

## 2017-01-02 ENCOUNTER — Ambulatory Visit
Admission: RE | Admit: 2017-01-02 | Discharge: 2017-01-02 | Disposition: A | Discharge status: Home / Self Care | Payer: Medicare Other | Source: Ambulatory Visit | Attending: Internal Medicine | Admitting: Internal Medicine

## 2017-01-02 DIAGNOSIS — Z1231 Encounter for screening mammogram for malignant neoplasm of breast: Secondary | ICD-10-CM | POA: Diagnosis not present

## 2017-03-04 DIAGNOSIS — Z23 Encounter for immunization: Secondary | ICD-10-CM | POA: Diagnosis not present

## 2017-06-10 DIAGNOSIS — R82998 Other abnormal findings in urine: Secondary | ICD-10-CM | POA: Diagnosis not present

## 2017-06-10 DIAGNOSIS — Z79899 Other long term (current) drug therapy: Secondary | ICD-10-CM | POA: Diagnosis not present

## 2017-06-10 DIAGNOSIS — M810 Age-related osteoporosis without current pathological fracture: Secondary | ICD-10-CM | POA: Diagnosis not present

## 2017-06-17 DIAGNOSIS — Z853 Personal history of malignant neoplasm of breast: Secondary | ICD-10-CM | POA: Diagnosis not present

## 2017-06-17 DIAGNOSIS — Z6824 Body mass index (BMI) 24.0-24.9, adult: Secondary | ICD-10-CM | POA: Diagnosis not present

## 2017-06-17 DIAGNOSIS — M810 Age-related osteoporosis without current pathological fracture: Secondary | ICD-10-CM | POA: Diagnosis not present

## 2017-06-17 DIAGNOSIS — Z0000 Encounter for general adult medical examination without abnormal findings: Secondary | ICD-10-CM | POA: Diagnosis not present

## 2017-06-17 DIAGNOSIS — Z1389 Encounter for screening for other disorder: Secondary | ICD-10-CM | POA: Diagnosis not present

## 2017-06-18 DIAGNOSIS — Z1212 Encounter for screening for malignant neoplasm of rectum: Secondary | ICD-10-CM | POA: Diagnosis not present

## 2017-11-28 ENCOUNTER — Other Ambulatory Visit: Payer: Self-pay | Admitting: Internal Medicine

## 2017-11-28 DIAGNOSIS — Z1231 Encounter for screening mammogram for malignant neoplasm of breast: Secondary | ICD-10-CM

## 2018-01-06 ENCOUNTER — Ambulatory Visit
Admission: RE | Admit: 2018-01-06 | Discharge: 2018-01-06 | Disposition: A | Discharge status: Home / Self Care | Payer: Medicare Other | Source: Ambulatory Visit | Attending: Internal Medicine | Admitting: Internal Medicine

## 2018-01-06 DIAGNOSIS — Z1231 Encounter for screening mammogram for malignant neoplasm of breast: Secondary | ICD-10-CM | POA: Diagnosis not present

## 2018-02-11 DIAGNOSIS — Z23 Encounter for immunization: Secondary | ICD-10-CM | POA: Diagnosis not present

## 2018-04-10 DIAGNOSIS — J301 Allergic rhinitis due to pollen: Secondary | ICD-10-CM | POA: Diagnosis not present

## 2018-04-10 DIAGNOSIS — H66001 Acute suppurative otitis media without spontaneous rupture of ear drum, right ear: Secondary | ICD-10-CM | POA: Diagnosis not present

## 2018-04-10 DIAGNOSIS — R030 Elevated blood-pressure reading, without diagnosis of hypertension: Secondary | ICD-10-CM | POA: Diagnosis not present

## 2018-04-23 DIAGNOSIS — H6983 Other specified disorders of Eustachian tube, bilateral: Secondary | ICD-10-CM | POA: Diagnosis not present

## 2018-04-23 DIAGNOSIS — Z6824 Body mass index (BMI) 24.0-24.9, adult: Secondary | ICD-10-CM | POA: Diagnosis not present

## 2018-06-25 DIAGNOSIS — Z79899 Other long term (current) drug therapy: Secondary | ICD-10-CM | POA: Diagnosis not present

## 2018-06-25 DIAGNOSIS — M810 Age-related osteoporosis without current pathological fracture: Secondary | ICD-10-CM | POA: Diagnosis not present

## 2018-06-25 DIAGNOSIS — R82998 Other abnormal findings in urine: Secondary | ICD-10-CM | POA: Diagnosis not present

## 2018-06-26 DIAGNOSIS — Z1331 Encounter for screening for depression: Secondary | ICD-10-CM | POA: Diagnosis not present

## 2018-06-26 DIAGNOSIS — Z6824 Body mass index (BMI) 24.0-24.9, adult: Secondary | ICD-10-CM | POA: Diagnosis not present

## 2018-06-26 DIAGNOSIS — Z853 Personal history of malignant neoplasm of breast: Secondary | ICD-10-CM | POA: Diagnosis not present

## 2018-06-26 DIAGNOSIS — Z1339 Encounter for screening examination for other mental health and behavioral disorders: Secondary | ICD-10-CM | POA: Diagnosis not present

## 2018-06-26 DIAGNOSIS — Z0000 Encounter for general adult medical examination without abnormal findings: Secondary | ICD-10-CM | POA: Diagnosis not present

## 2018-06-26 DIAGNOSIS — Z23 Encounter for immunization: Secondary | ICD-10-CM | POA: Diagnosis not present

## 2018-06-26 DIAGNOSIS — M810 Age-related osteoporosis without current pathological fracture: Secondary | ICD-10-CM | POA: Diagnosis not present

## 2018-06-27 DIAGNOSIS — Z1212 Encounter for screening for malignant neoplasm of rectum: Secondary | ICD-10-CM | POA: Diagnosis not present

## 2018-08-02 ENCOUNTER — Encounter: Payer: Self-pay | Admitting: Gastroenterology

## 2018-12-11 ENCOUNTER — Other Ambulatory Visit: Payer: Self-pay | Admitting: Internal Medicine

## 2018-12-11 DIAGNOSIS — Z1231 Encounter for screening mammogram for malignant neoplasm of breast: Secondary | ICD-10-CM

## 2019-01-22 ENCOUNTER — Ambulatory Visit: Payer: Medicare Other

## 2019-02-19 ENCOUNTER — Ambulatory Visit: Payer: Medicare Other

## 2019-03-04 DIAGNOSIS — Z23 Encounter for immunization: Secondary | ICD-10-CM | POA: Diagnosis not present

## 2019-04-03 ENCOUNTER — Ambulatory Visit
Admission: RE | Admit: 2019-04-03 | Discharge: 2019-04-03 | Disposition: A | Discharge status: Home / Self Care | Payer: Medicare Other | Source: Ambulatory Visit | Attending: Internal Medicine | Admitting: Internal Medicine

## 2019-04-03 ENCOUNTER — Other Ambulatory Visit: Payer: Self-pay

## 2019-04-03 DIAGNOSIS — Z1231 Encounter for screening mammogram for malignant neoplasm of breast: Secondary | ICD-10-CM | POA: Diagnosis not present

## 2019-06-24 DIAGNOSIS — M810 Age-related osteoporosis without current pathological fracture: Secondary | ICD-10-CM | POA: Diagnosis not present

## 2019-06-24 DIAGNOSIS — R7989 Other specified abnormal findings of blood chemistry: Secondary | ICD-10-CM | POA: Diagnosis not present

## 2019-06-26 DIAGNOSIS — R82998 Other abnormal findings in urine: Secondary | ICD-10-CM | POA: Diagnosis not present

## 2019-07-01 DIAGNOSIS — Z853 Personal history of malignant neoplasm of breast: Secondary | ICD-10-CM | POA: Diagnosis not present

## 2019-07-01 DIAGNOSIS — Z1339 Encounter for screening examination for other mental health and behavioral disorders: Secondary | ICD-10-CM | POA: Diagnosis not present

## 2019-07-01 DIAGNOSIS — M810 Age-related osteoporosis without current pathological fracture: Secondary | ICD-10-CM | POA: Diagnosis not present

## 2019-07-01 DIAGNOSIS — Z0000 Encounter for general adult medical examination without abnormal findings: Secondary | ICD-10-CM | POA: Diagnosis not present

## 2019-07-01 DIAGNOSIS — Z1331 Encounter for screening for depression: Secondary | ICD-10-CM | POA: Diagnosis not present

## 2019-07-03 DIAGNOSIS — Z1212 Encounter for screening for malignant neoplasm of rectum: Secondary | ICD-10-CM | POA: Diagnosis not present

## 2019-07-03 LAB — IFOBT (OCCULT BLOOD): IFOBT: POSITIVE

## 2019-07-06 ENCOUNTER — Encounter: Payer: Self-pay | Admitting: Gastroenterology

## 2019-07-30 ENCOUNTER — Other Ambulatory Visit: Payer: Self-pay

## 2019-07-30 ENCOUNTER — Ambulatory Visit (AMBULATORY_SURGERY_CENTER): Payer: Self-pay | Admitting: *Deleted

## 2019-07-30 VITALS — Temp 97.1°F | Ht 63.0 in | Wt 138.0 lb

## 2019-07-30 DIAGNOSIS — R195 Other fecal abnormalities: Secondary | ICD-10-CM

## 2019-07-30 DIAGNOSIS — Z800 Family history of malignant neoplasm of digestive organs: Secondary | ICD-10-CM

## 2019-07-30 NOTE — Progress Notes (Signed)
No egg or soy allergy known to patient  No issues with past sedation with any surgeries  or procedures, no intubation problems  No diet pills per patient No home 02 use per patient  No blood thinners per patient  Pt states  issues with constipation occ- uses stool softeners PRN- this is not a chronic issue  No A fib or A flutter  EMMI video sent to pt's e mail  Pt completed her Covid vaccine series 2-23- 2 weeks is 3-9 so no pre screen covid test required   Due to the COVID-19 pandemic we are asking patients to follow these guidelines. Please only bring one care partner. Please be aware that your care partner may wait in the car in the parking lot or if they feel like they will be too hot to wait in the car, they may wait in the lobby on the 4th floor. All care partners are required to wear a mask the entire time (we do not have any that we can provide them), they need to practice social distancing, and we will do a Covid check for all patient's and care partners when you arrive. Also we will check their temperature and your temperature. If the care partner waits in their car they need to stay in the parking lot the entire time and we will call them on their cell phone when the patient is ready for discharge so they can bring the car to the front of the building. Also all patient's will need to wear a mask into building.

## 2019-08-12 ENCOUNTER — Encounter: Payer: Self-pay | Admitting: Gastroenterology

## 2019-08-12 ENCOUNTER — Ambulatory Visit (AMBULATORY_SURGERY_CENTER): Payer: Medicare Other | Admitting: Gastroenterology

## 2019-08-12 ENCOUNTER — Other Ambulatory Visit: Payer: Self-pay

## 2019-08-12 VITALS — BP 135/60 | HR 75 | Temp 97.3°F | Resp 10 | Ht 63.0 in | Wt 138.0 lb

## 2019-08-12 DIAGNOSIS — R195 Other fecal abnormalities: Secondary | ICD-10-CM

## 2019-08-12 DIAGNOSIS — D122 Benign neoplasm of ascending colon: Secondary | ICD-10-CM

## 2019-08-12 DIAGNOSIS — Z800 Family history of malignant neoplasm of digestive organs: Secondary | ICD-10-CM

## 2019-08-12 HISTORY — PX: COLONOSCOPY: SHX174

## 2019-08-12 MED ORDER — SODIUM CHLORIDE 0.9 % IV SOLN: 500.0000 mL | Freq: Once | INTRAVENOUS | Status: DC

## 2019-08-12 NOTE — Op Note (Addendum)
[place] Patient Name: Kathleen Shaw Procedure Date: 08/12/2019 9:55 AM MRN: [id number] Endoscopist: Ladene Artist , MD Age: 74 Referring MD:  Date of Birth: May 06, 1946 Gender: Female Account #: [id number] Procedure:                Colonoscopy Indications:              Positive fecal immunochemical test. Strong family                            history colon cancer. Medicines:                Monitored Anesthesia Care Procedure:                Pre-Anesthesia Assessment:                           - Prior to the procedure, a History and Physical                            was performed, and patient medications and                            allergies were reviewed. The patient's tolerance of                            previous anesthesia was also reviewed. The risks                            and benefits of the procedure and the sedation                            options and risks were discussed with the patient.                            All questions were answered, and informed consent                            was obtained. Prior Anticoagulants: The patient has                            taken no previous anticoagulant or antiplatelet                            agents. ASA Grade Assessment: II - A patient with                            mild systemic disease. After reviewing the risks                            and benefits, the patient was deemed in                            satisfactory condition to undergo the procedure.  After obtaining informed consent, the colonoscope                            was passed under direct vision. Throughout the                            procedure, the patient's blood pressure, pulse, and                            oxygen saturations were monitored continuously. The                            Colonoscope was introduced through the anus and                            advanced to the the cecum, identified  by                            appendiceal orifice and ileocecal valve. The                            ileocecal valve, appendiceal orifice, and rectum                            were photographed. The quality of the bowel                            preparation was excellent. The colonoscopy was                            performed without difficulty. The patient tolerated                            the procedure well. Scope In: 10:02:37 AM Scope Out: 10:21:34 AM Scope Withdrawal Time: 0 hours 13 minutes 11 seconds  Total Procedure Duration: 0 hours 18 minutes 57 seconds  Findings:                 The perianal and digital rectal examinations were                            normal.                           A 6 mm polyp was found in the ascending colon. The                            polyp was sessile. The polyp was removed with a                            cold snare. Resection and retrieval were complete.                           The exam was otherwise without abnormality on  direct and retroflexion views. Complications:            No immediate complications. Estimated blood loss:                            None. Estimated Blood Loss:     Estimated blood loss: none. Impression:               - One 6 mm polyp in the ascending colon. Removed                            and retrieved.                           - The examination was otherwise normal on direct                            and retroflexion views. Recommendation:           - Repeat colonoscopy in 5 years for surveillance /                            screening.                           - Patient has a contact number available for                            emergencies. The signs and symptoms of potential                            delayed complications were discussed with the                            patient. Return to normal activities tomorrow.                            Written discharge  instructions were provided to the                            patient.                           - Resume previous diet.                           - Continue present medications.                           - Await pathology results. Ladene Artist, MD 08/12/2019 10:25:33 AM This report has been signed electronically.

## 2019-08-12 NOTE — Progress Notes (Signed)
Called to room to assist during endoscopic procedure.  Patient ID and intended procedure confirmed with present staff. Received instructions for my participation in the procedure from the performing physician.  

## 2019-08-12 NOTE — Progress Notes (Signed)
A and O x3. Report to RN. Tolerated MAC anesthesia well.

## 2019-08-12 NOTE — Patient Instructions (Signed)
Please read handouts provided. Continue present medications. Await pathology results.   YOU HAD AN ENDOSCOPIC PROCEDURE TODAY AT THE [facility]:   Refer to the procedure report that was given to you for any specific questions about what was found during the examination.  If the procedure report does not answer your questions, please call your gastroenterologist to clarify.  If you requested that your care partner not be given the details of your procedure findings, then the procedure report has been included in a sealed envelope for you to review at your convenience later.  YOU SHOULD EXPECT: Some feelings of bloating in the abdomen. Passage of more gas than usual.  Walking can help get rid of the air that was put into your GI tract during the procedure and reduce the bloating. If you had a lower endoscopy (such as a colonoscopy or flexible sigmoidoscopy) you may notice spotting of blood in your stool or on the toilet paper. If you underwent a bowel prep for your procedure, you may not have a normal bowel movement for a few days.  Please Note:  You might notice some irritation and congestion in your nose or some drainage.  This is from the oxygen used during your procedure.  There is no need for concern and it should clear up in a day or so.  SYMPTOMS TO REPORT IMMEDIATELY:  Following lower endoscopy (colonoscopy or flexible sigmoidoscopy):  Excessive amounts of blood in the stool  Significant tenderness or worsening of abdominal pains  Swelling of the abdomen that is new, acute  Fever of 100F or higher   For urgent or emergent issues, a gastroenterologist can be reached at any hour by calling [phone]. Do not use MyChart messaging for urgent concerns.    DIET:  We do recommend a small meal at first, but then you may proceed to your regular diet.  Drink plenty of fluids but you should avoid alcoholic beverages for 24 hours.  ACTIVITY:  You should plan to take it easy  for the rest of today and you should NOT DRIVE or use heavy machinery until tomorrow (because of the sedation medicines used during the test).    FOLLOW UP: Our staff will call the number listed on your records 48-72 hours following your procedure to check on you and address any questions or concerns that you may have regarding the information given to you following your procedure. If we do not reach you, we will leave a message.  We will attempt to reach you two times.  During this call, we will ask if you have developed any symptoms of COVID 19. If you develop any symptoms (ie: fever, flu-like symptoms, shortness of breath, cough etc.) before then, please call [phone].  If you test positive for Covid 19 in the 2 weeks post procedure, please call and report this information to us.    If any biopsies were taken you will be contacted by phone or by letter within the next 1-3 weeks.  Please call us at [phone] if you have not heard about the biopsies in 3 weeks.    SIGNATURES/CONFIDENTIALITY: You and/or your care partner have signed paperwork which will be entered into your electronic medical record.  These signatures attest to the fact that that the information above on your After Visit Summary has been reviewed and is understood.  Full responsibility of the confidentiality of this discharge information lies with you and/or your care-partner.  

## 2019-08-12 NOTE — Progress Notes (Signed)
Pt's states no medical or surgical changes since previsit or office visit.  TEMP-LC  V/S-CW 

## 2019-08-14 ENCOUNTER — Telehealth: Payer: Self-pay | Admitting: *Deleted

## 2019-08-14 NOTE — Telephone Encounter (Signed)
  Follow up Call-  Call back number 08/12/2019  Post procedure Call Back phone  # [phone]  Permission to leave phone message Yes  Some recent data might be hidden     Patient questions:  Do you have a fever, pain , or abdominal swelling? No. Pain Score  0 *  Have you tolerated food without any problems? Yes.    Have you been able to return to your normal activities? Yes.    Do you have any questions about your discharge instructions: Diet   No. Medications  No. Follow up visit  No.  Do you have questions or concerns about your Care? No.  Actions: * If pain score is 4 or above: No action needed, pain <4.  1. Have you developed a fever since your procedure? no  2.   Have you had an respiratory symptoms (SOB or cough) since your procedure? no  3.   Have you tested positive for COVID 19 since your procedure no  4.   Have you had any family members/close contacts diagnosed with the COVID 19 since your procedure?  no   If yes to any of these questions please route to Joylene John, RN and Alphonsa Gin, Therapist, sports.

## 2019-08-20 ENCOUNTER — Encounter: Payer: Self-pay | Admitting: Gastroenterology

## 2019-11-20 DIAGNOSIS — S46812A Strain of other muscles, fascia and tendons at shoulder and upper arm level, left arm, initial encounter: Secondary | ICD-10-CM | POA: Diagnosis not present

## 2019-12-08 DIAGNOSIS — D045 Carcinoma in situ of skin of trunk: Secondary | ICD-10-CM | POA: Diagnosis not present

## 2019-12-08 DIAGNOSIS — L821 Other seborrheic keratosis: Secondary | ICD-10-CM | POA: Diagnosis not present

## 2019-12-08 DIAGNOSIS — D485 Neoplasm of uncertain behavior of skin: Secondary | ICD-10-CM | POA: Diagnosis not present

## 2020-01-19 DIAGNOSIS — D045 Carcinoma in situ of skin of trunk: Secondary | ICD-10-CM | POA: Diagnosis not present

## 2020-01-21 ENCOUNTER — Encounter: Payer: Self-pay | Admitting: Genetic Counselor

## 2020-01-22 DIAGNOSIS — Z23 Encounter for immunization: Secondary | ICD-10-CM | POA: Diagnosis not present

## 2020-02-02 ENCOUNTER — Encounter: Payer: Self-pay | Admitting: Genetic Counselor

## 2020-02-22 ENCOUNTER — Other Ambulatory Visit: Payer: Self-pay

## 2020-02-22 ENCOUNTER — Inpatient Hospital Stay: Payer: Medicare Other | Attending: Genetic Counselor | Admitting: Genetic Counselor

## 2020-02-22 ENCOUNTER — Encounter: Payer: Self-pay | Admitting: Genetic Counselor

## 2020-02-22 ENCOUNTER — Inpatient Hospital Stay: Payer: Medicare Other

## 2020-02-22 DIAGNOSIS — Z853 Personal history of malignant neoplasm of breast: Secondary | ICD-10-CM | POA: Diagnosis not present

## 2020-02-22 DIAGNOSIS — Z1379 Encounter for other screening for genetic and chromosomal anomalies: Secondary | ICD-10-CM

## 2020-02-22 DIAGNOSIS — Z8041 Family history of malignant neoplasm of ovary: Secondary | ICD-10-CM

## 2020-02-22 DIAGNOSIS — Z800 Family history of malignant neoplasm of digestive organs: Secondary | ICD-10-CM

## 2020-02-22 DIAGNOSIS — Z803 Family history of malignant neoplasm of breast: Secondary | ICD-10-CM | POA: Diagnosis not present

## 2020-02-22 LAB — GENETIC SCREENING ORDER

## 2020-02-22 NOTE — Progress Notes (Signed)
REFERRING PROVIDER: Haywood Pao, MD [address] [place],  [place] [address]  PRIMARY PROVIDER:  Tisovec, Fransico Him, MD  PRIMARY REASON FOR VISIT:  1. Family history of breast cancer   2. Family history of colon cancer   3. Family history of ovarian cancer   4. Personal history of malignant neoplasm of breast      HISTORY OF PRESENT ILLNESS:   Kathleen Shaw, a 74 y.o. female, was seen for a [place] cancer genetics consultation at the request of Dr. Osborne Casco due to a personal and family history of cancer.  Kathleen Shaw presents to clinic today to discuss the possibility of a hereditary predisposition to cancer, genetic testing, and to further clarify her future cancer risks, as well as potential cancer risks for family members.   In 2009, at the age of 49, Kathleen Shaw was diagnosed with triple negative breast cancer. The treatment plan included lumpectomy and radiation.  She underwent genetic testing at the time for mutations within the BRCA1 and BRCA2 genes.  She was negative at that time.    CANCER HISTORY:  Oncology History   No history exists.     RISK FACTORS:  Menarche was at age 71.  First live birth at age 75.  OCP use for approximately 30 years.  Ovaries intact: yes.  Hysterectomy: no.  Menopausal status: postmenopausal.  HRT use: 0 years. Colonoscopy: yes; normal. Mammogram within the last year: yes. Number of breast biopsies: 1. Up to date with pelvic exams: yes. Any excessive radiation exposure in the past: no  Past Medical History:  Diagnosis Date  . Allergy   . Breast cancer ([place]) 2009   right  . Constipation    used stool softener prn- not chronic- she thinks due to decreased activity with covid   . Family history of breast cancer   . Family history of colon cancer   . Family history of ovarian cancer   . Osteoporosis     Past Surgical History:  Procedure Laterality Date  . BREAST LUMPECTOMY  2009   lumpectomy- right  . [place]   right  . COLONOSCOPY  08/12/2019   2015    Social History   Socioeconomic History  . Marital status: Widowed    Spouse name: Not on file  . Number of children: Not on file  . Years of education: Not on file  . Highest education level: Not on file  Occupational History  . Not on file  Tobacco Use  . Smoking status: Never Smoker  . Smokeless tobacco: Never Used  Substance and Sexual Activity  . Alcohol use: Yes    Alcohol/week: 1.0 - 2.0 standard drink    Types: 1 - 2 Glasses of wine per week    Comment: 1/ WEEK  . Drug use: No  . Sexual activity: Not on file  Other Topics Concern  . Not on file  Social History Narrative  . Not on file   Social Determinants of Health   Financial Resource Strain:   . Difficulty of Paying Living Expenses: Not on file  Food Insecurity:   . Worried About Charity fundraiser in the Last Year: Not on file  . Ran Out of Food in the Last Year: Not on file  Transportation Needs:   . Lack of Transportation (Medical): Not on file  . Lack of Transportation (Non-Medical): Not on file  Physical Activity:   . Days of Exercise per Week: Not on file  .  Minutes of Exercise per Session: Not on file  Stress:   . Feeling of Stress : Not on file  Social Connections:   . Frequency of Communication with Friends and Family: Not on file  . Frequency of Social Gatherings with Friends and Family: Not on file  . Attends Religious Services: Not on file  . Active Member of Clubs or Organizations: Not on file  . Attends Archivist Meetings: Not on file  . Marital Status: Not on file     FAMILY HISTORY:  We obtained a detailed, 4-generation family history.  Significant diagnoses are listed below: Family History  Problem Relation Age of Onset  . COPD Mother   . Colon cancer Mother 61  . Breast cancer Mother 20  . Diverticulosis Father   . Heart attack Father   . Colon cancer Maternal Grandfather        dx in mid 56s  . Rectal cancer  Maternal Grandfather   . Ovarian cancer Paternal Aunt   . Lung cancer Paternal Uncle   . Other Paternal Grandmother        benign tumor removed from abd that weighted 23#  . Colon cancer Other   . Colon polyps Neg Hx   . Esophageal cancer Neg Hx   . Stomach cancer Neg Hx     The patient has one daughter who is cancer free.  She is an only child.  Both parents are deceased.    The patient's father had a full sister who died as an infant from nephritis.  He had three maternal half siblings, two sisters and a brother.  One sister had ovarian cancer and another had lung cancer.  The paternal grandparents died of non-cancer related issues in their 3's.  The patient's mother had breast cancer at 51 and colon cancer at 25.  She was an only child.  Her father had colon cancer in his 77's. He had siblings with cancer and his mother may have had colon cancer.  The maternal grandmother had a benign tumor removed from her abdomen.  There is no other reported cancer history.  Kathleen Shaw is unaware of previous family history of genetic testing for hereditary cancer risks. Patient's maternal ancestors are of [place] descent, and paternal ancestors are of [place] descent. There is no reported Ashkenazi Jewish ancestry. There is no known consanguinity.  GENETIC COUNSELING ASSESSMENT: Kathleen Shaw is a 74 y.o. female with a personal and family history of cancer which is somewhat suggestive of a hereditary cancer syndrome and predisposition to cancer given her triple negative status of her breast cancer and the family history of cancer. We, therefore, discussed and recommended the following at today's visit.   DISCUSSION: We discussed that 5 - 10% of breast cancer is hereditary, with most cases associated with BRCA mutations.  The patient tested negative for BRCA mutations in 2009. We discussed that at that time, there were mutations that were missed, so updated testing could find those.  There are other  genes that can be associated with hereditary breast cancer syndromes.  These include ATM, CHEK2 and PALB2.  We discussed that testing is beneficial for several reasons including knowing how to follow individuals after completing their treatment, identifying whether potential treatment options such as PARP inhibitors would be beneficial, and understand if other family members could be at risk for cancer and allow them to undergo genetic testing.   We reviewed the characteristics, features and inheritance patterns of hereditary cancer syndromes. We  also discussed genetic testing, including the appropriate family members to test, the process of testing, insurance coverage and turn-around-time for results. We discussed the implications of a negative, positive, carrier and/or variant of uncertain significant result. We recommended Kathleen Shaw pursue genetic testing for the CancerNext-Expanded+RNA gene panel. The CancerNext-Expanded gene panel offered by [facility] and includes sequencing and rearrangement analysis for the following 77 genes: AIP, ALK, APC*, ATM*, AXIN2, BAP1, BARD1, BLM, BMPR1A, BRCA1*, BRCA2*, BRIP1*, CDC73, CDH1*, CDK4, CDKN1B, CDKN2A, CHEK2*, CTNNA1, DICER1, FANCC, FH, FLCN, GALNT12, KIF1B, LZTR1, MAX, MEN1, MET, MLH1*, MSH2*, MSH3, MSH6*, MUTYH*, NBN, NF1*, NF2, NTHL1, PALB2*, PHOX2B, PMS2*, POT1, PRKAR1A, PTCH1, PTEN*, RAD51C*, RAD51D*, RB1, RECQL, RET, SDHA, SDHAF2, SDHB, SDHC, SDHD, SMAD4, SMARCA4, SMARCB1, SMARCE1, STK11, SUFU, TMEM127, TP53*, TSC1, TSC2, VHL and XRCC2 (sequencing and deletion/duplication); EGFR, EGLN1, HOXB13, KIT, MITF, PDGFRA, POLD1, and POLE (sequencing only); EPCAM and GREM1 (deletion/duplication only). DNA and RNA analyses performed for * genes.   Based on Ms. Fluitt's personal and family history of cancer, she meets medical criteria for genetic testing. Despite that she meets criteria, she may still have an out of pocket cost. We discussed that if her out of  pocket cost for testing is over $100, the laboratory will call and confirm whether she wants to proceed with testing.  If the out of pocket cost of testing is less than $100 she will be billed by the genetic testing laboratory.   PLAN: After considering the risks, benefits, and limitations, Kathleen Shaw provided informed consent to pursue genetic testing and the blood sample was sent to [facility] for analysis of the CancerNext-Expanded+RNA. Results should be available within approximately 2-3 weeks' time, at which point they will be disclosed by telephone to Kathleen Shaw, as will any additional recommendations warranted by these results. Kathleen Shaw will receive a summary of her genetic counseling visit and a copy of her results once available. This information will also be available in Epic.   Lastly, we encouraged Kathleen Shaw to remain in contact with cancer genetics annually so that we can continuously update the family history and inform her of any changes in cancer genetics and testing that may be of benefit for this family.   Kathleen Shaw questions were answered to her satisfaction today. Our contact information was provided should additional questions or concerns arise. Thank you for the referral and allowing [place] to share in the care of your patient.   Mylen Mangan P. Florene Glen, [place], [facility] Licensed, Insurance risk surveyor [email or web address] phone: [phone]  The patient was seen for a total of 45 minutes in face-to-face genetic counseling.  This patient was discussed with Drs. Magrinat, Lindi Adie and/or Burr Medico who agrees with the above.    _______________________________________________________________________ For Office Staff:  Number of people involved in session: 1 Was an Intern/ student involved with case: no

## 2020-03-02 ENCOUNTER — Other Ambulatory Visit: Payer: Self-pay | Admitting: Internal Medicine

## 2020-03-02 DIAGNOSIS — Z1231 Encounter for screening mammogram for malignant neoplasm of breast: Secondary | ICD-10-CM

## 2020-03-08 ENCOUNTER — Encounter: Payer: Self-pay | Admitting: Genetic Counselor

## 2020-03-08 ENCOUNTER — Telehealth: Payer: Self-pay | Admitting: Genetic Counselor

## 2020-03-08 DIAGNOSIS — Z1379 Encounter for other screening for genetic and chromosomal anomalies: Secondary | ICD-10-CM | POA: Insufficient documentation

## 2020-03-08 NOTE — Telephone Encounter (Signed)
LM on VM that results are back and to please call.  Left CB instructions. 

## 2020-03-09 ENCOUNTER — Ambulatory Visit: Payer: Self-pay | Admitting: Genetic Counselor

## 2020-03-09 DIAGNOSIS — Z1379 Encounter for other screening for genetic and chromosomal anomalies: Secondary | ICD-10-CM

## 2020-03-09 NOTE — Telephone Encounter (Signed)
Revealed negative genetic testing.  Discussed that we do not know why she has breast cancer or why there is cancer in the family. It could be due to a different gene that we are not testing, or maybe our current technology may not be able to pick something up.  It will be important for her to keep in contact with genetics to keep up with whether additional testing may be needed. 

## 2020-03-09 NOTE — Progress Notes (Signed)
HPI:  Kathleen Shaw was previously seen in the [facility] clinic due to a personal and family history of breast cancer and family history of colon cancer and concerns regarding a hereditary predisposition to cancer. Please refer to our prior cancer genetics clinic note for more information regarding our discussion, assessment and recommendations, at the time. Ms. Billick recent genetic test results were disclosed to her, as were recommendations warranted by these results. These results and recommendations are discussed in more detail below.  CANCER HISTORY:  Oncology History  Cancer of breast right T1cNo Triple neg S/P lumpectomy, SLN Bx, Rad Chemo 01/2008  02/01/2011 Initial Diagnosis   Cancer of breast right T1cNo Triple neg S/P lumpectomy, SLN Bx, Rad Chemo 01/2008   03/07/2020 Genetic Testing   Negative genetic testing on the CancerNext-Expanded+RNAinsight testing.  The CancerNext-Expanded gene panel offered by [facility] and includes sequencing and rearrangement analysis for the following 77 genes: AIP, ALK, APC*, ATM*, AXIN2, BAP1, BARD1, BLM, BMPR1A, BRCA1*, BRCA2*, BRIP1*, CDC73, CDH1*, CDK4, CDKN1B, CDKN2A, CHEK2*, CTNNA1, DICER1, FANCC, FH, FLCN, GALNT12, KIF1B, LZTR1, MAX, MEN1, MET, MLH1*, MSH2*, MSH3, MSH6*, MUTYH*, NBN, NF1*, NF2, NTHL1, PALB2*, PHOX2B, PMS2*, POT1, PRKAR1A, PTCH1, PTEN*, RAD51C*, RAD51D*, RB1, RECQL, RET, SDHA, SDHAF2, SDHB, SDHC, SDHD, SMAD4, SMARCA4, SMARCB1, SMARCE1, STK11, SUFU, TMEM127, TP53*, TSC1, TSC2, VHL and XRCC2 (sequencing and deletion/duplication); EGFR, EGLN1, HOXB13, KIT, MITF, PDGFRA, POLD1, and POLE (sequencing only); EPCAM and GREM1 (deletion/duplication only). DNA and RNA analyses performed for * genes. The report date is March 07, 2020.     FAMILY HISTORY:  We obtained a detailed, 4-generation family history.  Significant diagnoses are listed below: Family History  Problem Relation Age of Onset  . COPD Mother   . Colon  cancer Mother 6  . Breast cancer Mother 40  . Diverticulosis Father   . Heart attack Father   . Colon cancer Maternal Grandfather        dx in mid 60s  . Rectal cancer Maternal Grandfather   . Ovarian cancer Paternal Aunt   . Lung cancer Paternal Uncle   . Other Paternal Grandmother        benign tumor removed from abd that weighted 23#  . Colon cancer Other   . Colon polyps Neg Hx   . Esophageal cancer Neg Hx   . Stomach cancer Neg Hx     The patient has one daughter who is cancer free.  She is an only child.  Both parents are deceased.    The patient's father had a full sister who died as an infant from nephritis.  He had three maternal half siblings, two sisters and a brother.  One sister had ovarian cancer and another had lung cancer.  The paternal grandparents died of non-cancer related issues in their 38's.  The patient's mother had breast cancer at 60 and colon cancer at 41.  She was an only child.  Her father had colon cancer in his 75's. He had siblings with cancer and his mother may have had colon cancer.  The maternal grandmother had a benign tumor removed from her abdomen.  There is no other reported cancer history.  Ms. Spurrier is unaware of previous family history of genetic testing for hereditary cancer risks. Patient's maternal ancestors are of [place] descent, and paternal ancestors are of English descent. There is no reported Ashkenazi Jewish ancestry. There is no known consanguinity.   GENETIC TEST RESULTS: Genetic testing reported out on March 05, 2020 through the CancerNext-Expanded+RNAinsight  cancer panel found no pathogenic mutations. The CancerNext-Expanded gene panel offered by [facility] and includes sequencing and rearrangement analysis for the following 77 genes: AIP, ALK, APC*, ATM*, AXIN2, BAP1, BARD1, BLM, BMPR1A, BRCA1*, BRCA2*, BRIP1*, CDC73, CDH1*, CDK4, CDKN1B, CDKN2A, CHEK2*, CTNNA1, DICER1, FANCC, FH, FLCN, GALNT12, KIF1B, LZTR1, MAX, MEN1,  MET, MLH1*, MSH2*, MSH3, MSH6*, MUTYH*, NBN, NF1*, NF2, NTHL1, PALB2*, PHOX2B, PMS2*, POT1, PRKAR1A, PTCH1, PTEN*, RAD51C*, RAD51D*, RB1, RECQL, RET, SDHA, SDHAF2, SDHB, SDHC, SDHD, SMAD4, SMARCA4, SMARCB1, SMARCE1, STK11, SUFU, TMEM127, TP53*, TSC1, TSC2, VHL and XRCC2 (sequencing and deletion/duplication); EGFR, EGLN1, HOXB13, KIT, MITF, PDGFRA, POLD1, and POLE (sequencing only); EPCAM and GREM1 (deletion/duplication only). DNA and RNA analyses performed for * genes. The test report has been scanned into EPIC and is located under the Molecular Pathology section of the Results Review tab.  A portion of the result report is included below for reference.     We discussed with Ms. Weitzman that because current genetic testing is not perfect, it is possible there may be a gene mutation in one of these genes that current testing cannot detect, but that chance is small.  We also discussed, that there could be another gene that has not yet been discovered, or that we have not yet tested, that is responsible for the cancer diagnoses in the family. It is also possible there is a hereditary cause for the cancer in the family that Ms. Noell did not inherit and therefore was not identified in her testing.  Therefore, it is important to remain in touch with cancer genetics in the future so that we can continue to offer Ms. Fallen the most up to date genetic testing.   ADDITIONAL GENETIC TESTING: We discussed with Ms. Sauser that her genetic testing was fairly extensive.  If there are genes identified to increase cancer risk that can be analyzed in the future, we would be happy to discuss and coordinate this testing at that time.    CANCER SCREENING RECOMMENDATIONS: Ms. Weider test result is considered negative (normal).  This means that we have not identified a hereditary cause for her personal and family history of breast cancer and family history of colon cancer at this time. Most cancers happen by  chance and this negative test suggests that her cancer may fall into this category.    While reassuring, this does not definitively rule out a hereditary predisposition to cancer. It is still possible that there could be genetic mutations that are undetectable by current technology. There could be genetic mutations in genes that have not been tested or identified to increase cancer risk.  Therefore, it is recommended she continue to follow the cancer management and screening guidelines provided by her oncology and primary healthcare provider.   An individual's cancer risk and medical management are not determined by genetic test results alone. Overall cancer risk assessment incorporates additional factors, including personal medical history, family history, and any available genetic information that may result in a personalized plan for cancer prevention and surveillance  RECOMMENDATIONS FOR FAMILY MEMBERS:  Individuals in this family might be at some increased risk of developing cancer, over the general population risk, simply due to the family history of cancer.  We recommended women in this family have a yearly mammogram beginning at age 74, or 67 years younger than the earliest onset of cancer, an annual clinical breast exam, and perform monthly breast self-exams. Women in this family should also have a gynecological exam as recommended by their primary provider.  All family members should be referred for colonoscopy starting at age 55.  FOLLOW-UP: Lastly, we discussed with Ms. Crumbley that cancer genetics is a rapidly advancing field and it is possible that new genetic tests will be appropriate for her and/or her family members in the future. We encouraged her to remain in contact with cancer genetics on an annual basis so we can update her personal and family histories and let her know of advances in cancer genetics that may benefit this family.   Our contact number was provided. Ms. Mcclaine  questions were answered to her satisfaction, and she knows she is welcome to call us at anytime with additional questions or concerns.   Roma Kayser, [place], [facility] Licensed, Certified Genetic Counselor [email or web address]

## 2020-03-30 DIAGNOSIS — Z23 Encounter for immunization: Secondary | ICD-10-CM | POA: Diagnosis not present

## 2020-04-05 ENCOUNTER — Ambulatory Visit: Payer: Medicare Other

## 2020-05-11 ENCOUNTER — Other Ambulatory Visit: Payer: Self-pay

## 2020-05-11 ENCOUNTER — Ambulatory Visit
Admission: RE | Admit: 2020-05-11 | Discharge: 2020-05-11 | Disposition: A | Discharge status: Home / Self Care | Payer: Medicare Other | Source: Ambulatory Visit | Attending: Internal Medicine | Admitting: Internal Medicine

## 2020-05-11 DIAGNOSIS — Z1231 Encounter for screening mammogram for malignant neoplasm of breast: Secondary | ICD-10-CM

## 2020-06-28 DIAGNOSIS — R7989 Other specified abnormal findings of blood chemistry: Secondary | ICD-10-CM | POA: Diagnosis not present

## 2020-06-28 DIAGNOSIS — M859 Disorder of bone density and structure, unspecified: Secondary | ICD-10-CM | POA: Diagnosis not present

## 2020-07-01 DIAGNOSIS — Z0000 Encounter for general adult medical examination without abnormal findings: Secondary | ICD-10-CM | POA: Diagnosis not present

## 2020-07-05 DIAGNOSIS — Z853 Personal history of malignant neoplasm of breast: Secondary | ICD-10-CM | POA: Diagnosis not present

## 2020-07-05 DIAGNOSIS — M8580 Other specified disorders of bone density and structure, unspecified site: Secondary | ICD-10-CM | POA: Diagnosis not present

## 2020-07-05 DIAGNOSIS — R82998 Other abnormal findings in urine: Secondary | ICD-10-CM | POA: Diagnosis not present

## 2020-07-05 DIAGNOSIS — Z1339 Encounter for screening examination for other mental health and behavioral disorders: Secondary | ICD-10-CM | POA: Diagnosis not present

## 2020-07-05 DIAGNOSIS — M8589 Other specified disorders of bone density and structure, multiple sites: Secondary | ICD-10-CM | POA: Diagnosis not present

## 2020-07-05 DIAGNOSIS — Z1331 Encounter for screening for depression: Secondary | ICD-10-CM | POA: Diagnosis not present

## 2020-07-05 DIAGNOSIS — Z0000 Encounter for general adult medical examination without abnormal findings: Secondary | ICD-10-CM | POA: Diagnosis not present

## 2020-09-13 DIAGNOSIS — Z23 Encounter for immunization: Secondary | ICD-10-CM | POA: Diagnosis not present

## 2021-02-14 DIAGNOSIS — Z23 Encounter for immunization: Secondary | ICD-10-CM | POA: Diagnosis not present

## 2021-03-22 DIAGNOSIS — Z23 Encounter for immunization: Secondary | ICD-10-CM | POA: Diagnosis not present

## 2021-04-03 ENCOUNTER — Other Ambulatory Visit: Payer: Self-pay | Admitting: Internal Medicine

## 2021-04-03 DIAGNOSIS — Z1231 Encounter for screening mammogram for malignant neoplasm of breast: Secondary | ICD-10-CM

## 2021-05-15 ENCOUNTER — Ambulatory Visit
Admission: RE | Admit: 2021-05-15 | Discharge: 2021-05-15 | Disposition: A | Discharge status: Home / Self Care | Payer: Medicare Other | Source: Ambulatory Visit | Attending: Internal Medicine | Admitting: Internal Medicine

## 2021-05-15 DIAGNOSIS — Z1231 Encounter for screening mammogram for malignant neoplasm of breast: Secondary | ICD-10-CM | POA: Diagnosis not present

## 2021-07-04 DIAGNOSIS — M859 Disorder of bone density and structure, unspecified: Secondary | ICD-10-CM | POA: Diagnosis not present

## 2021-07-04 DIAGNOSIS — Z79899 Other long term (current) drug therapy: Secondary | ICD-10-CM | POA: Diagnosis not present

## 2021-07-04 DIAGNOSIS — R7989 Other specified abnormal findings of blood chemistry: Secondary | ICD-10-CM | POA: Diagnosis not present

## 2021-07-11 DIAGNOSIS — Z0000 Encounter for general adult medical examination without abnormal findings: Secondary | ICD-10-CM | POA: Diagnosis not present

## 2021-07-11 DIAGNOSIS — Z1331 Encounter for screening for depression: Secondary | ICD-10-CM | POA: Diagnosis not present

## 2021-07-11 DIAGNOSIS — Z853 Personal history of malignant neoplasm of breast: Secondary | ICD-10-CM | POA: Diagnosis not present

## 2021-07-11 DIAGNOSIS — Z1339 Encounter for screening examination for other mental health and behavioral disorders: Secondary | ICD-10-CM | POA: Diagnosis not present

## 2021-07-11 DIAGNOSIS — M8580 Other specified disorders of bone density and structure, unspecified site: Secondary | ICD-10-CM | POA: Diagnosis not present

## 2021-07-11 DIAGNOSIS — R82998 Other abnormal findings in urine: Secondary | ICD-10-CM | POA: Diagnosis not present

## 2021-07-11 DIAGNOSIS — Z1212 Encounter for screening for malignant neoplasm of rectum: Secondary | ICD-10-CM | POA: Diagnosis not present

## 2021-07-25 DIAGNOSIS — H43813 Vitreous degeneration, bilateral: Secondary | ICD-10-CM | POA: Diagnosis not present

## 2021-07-25 DIAGNOSIS — H527 Unspecified disorder of refraction: Secondary | ICD-10-CM | POA: Diagnosis not present

## 2021-07-25 DIAGNOSIS — H02834 Dermatochalasis of left upper eyelid: Secondary | ICD-10-CM | POA: Diagnosis not present

## 2021-07-25 DIAGNOSIS — H02831 Dermatochalasis of right upper eyelid: Secondary | ICD-10-CM | POA: Diagnosis not present

## 2021-07-25 DIAGNOSIS — H25813 Combined forms of age-related cataract, bilateral: Secondary | ICD-10-CM | POA: Diagnosis not present

## 2022-03-08 DIAGNOSIS — Z23 Encounter for immunization: Secondary | ICD-10-CM | POA: Diagnosis not present

## 2022-04-16 ENCOUNTER — Other Ambulatory Visit: Payer: Self-pay | Admitting: Internal Medicine

## 2022-04-16 DIAGNOSIS — Z1231 Encounter for screening mammogram for malignant neoplasm of breast: Secondary | ICD-10-CM

## 2022-05-16 ENCOUNTER — Ambulatory Visit
Admission: RE | Admit: 2022-05-16 | Discharge: 2022-05-16 | Disposition: A | Discharge status: Home / Self Care | Payer: Medicare Other | Source: Ambulatory Visit | Attending: Internal Medicine | Admitting: Internal Medicine

## 2022-05-16 DIAGNOSIS — Z1231 Encounter for screening mammogram for malignant neoplasm of breast: Secondary | ICD-10-CM | POA: Diagnosis not present

## 2022-07-11 IMAGING — MG DIGITAL SCREENING BILAT W/ TOMO W/ CAD
6 of 10 series · 6 of 30 positions shown · non-contrast
Comparison: Previous exam(s).

CLINICAL DATA: Screening.

EXAM:
DIGITAL SCREENING BILATERAL MAMMOGRAM WITH TOMO AND CAD

[R CC synth-2D]
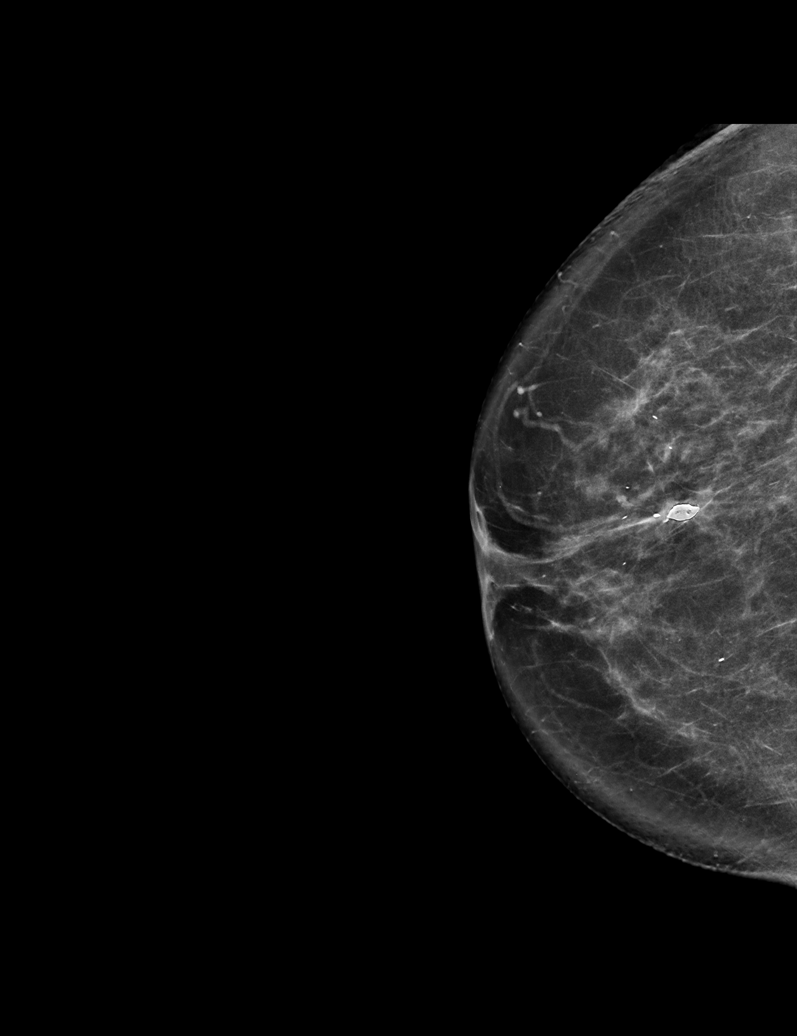

[R MLO synth-2D]
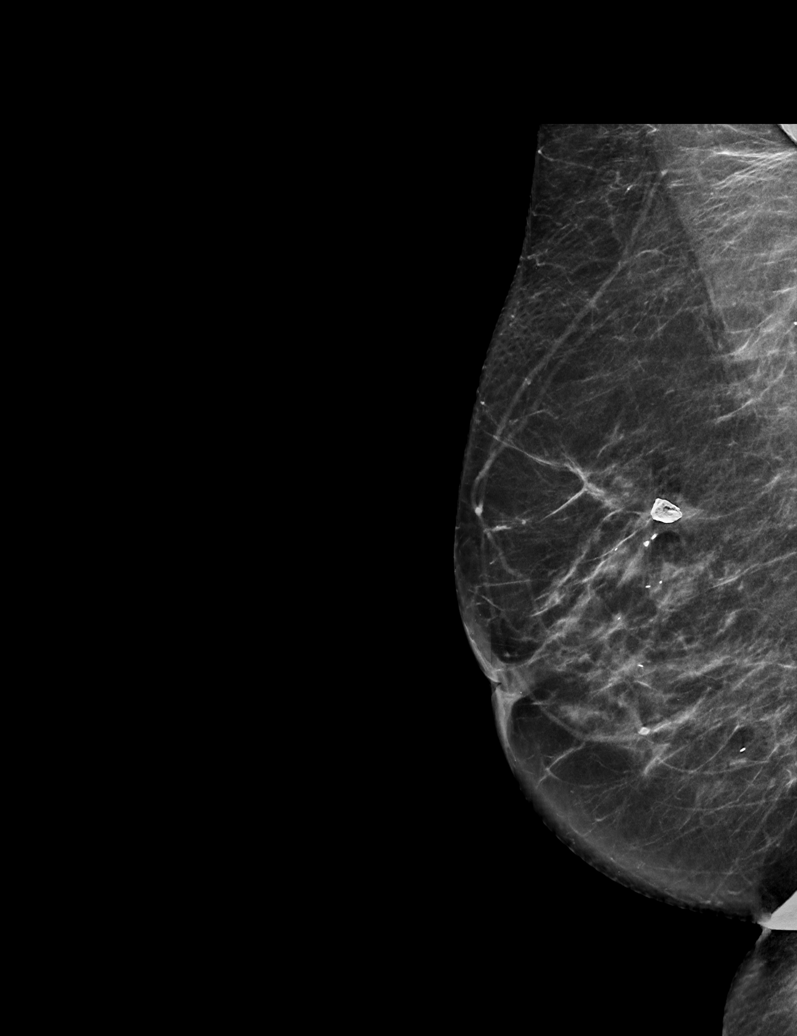

[L MLO synth-2D (1 of 2)]
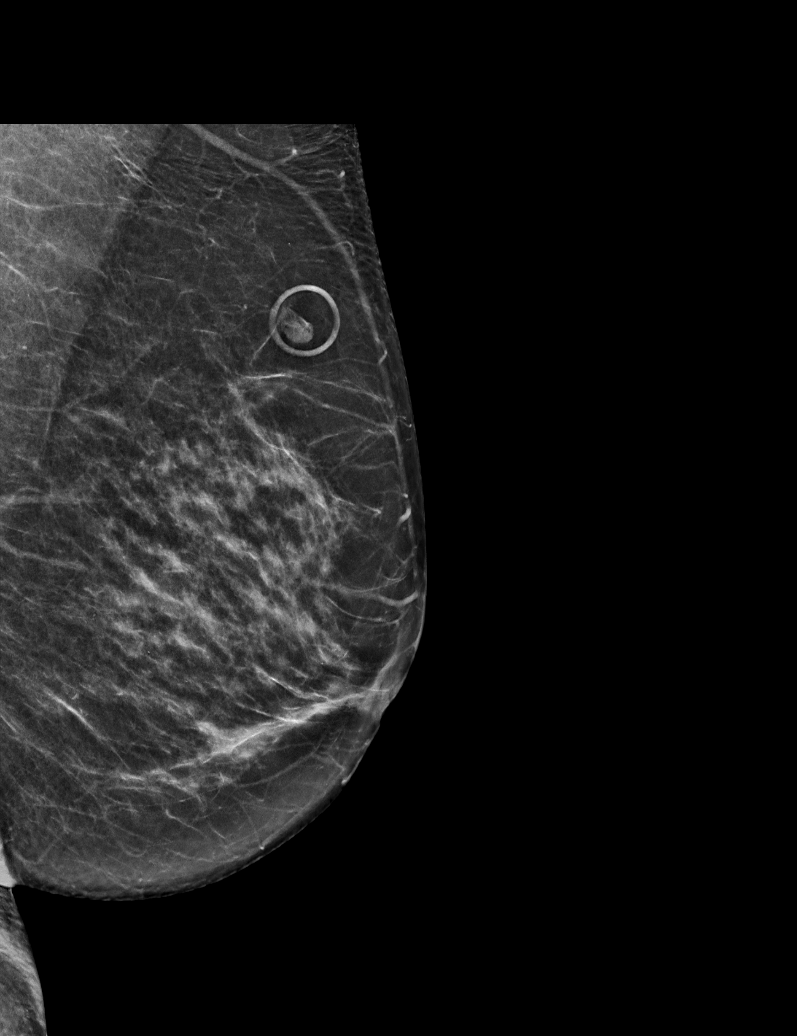

[L CC synth-2D]
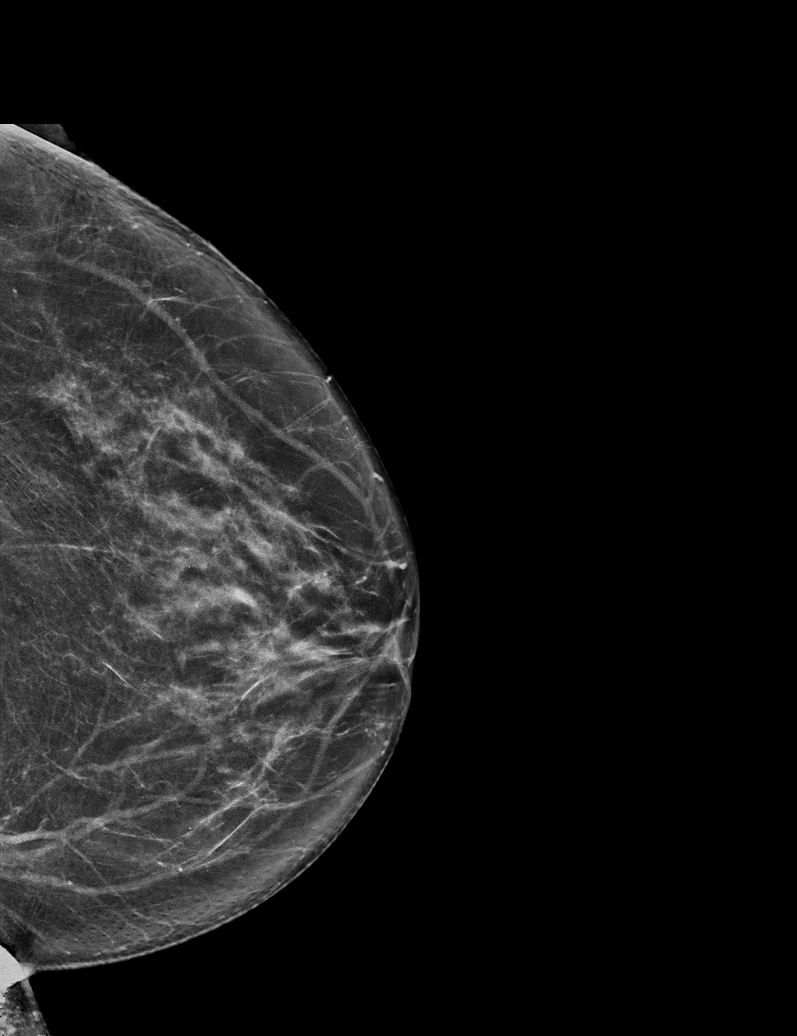

[L MLO synth-2D (2 of 2)]
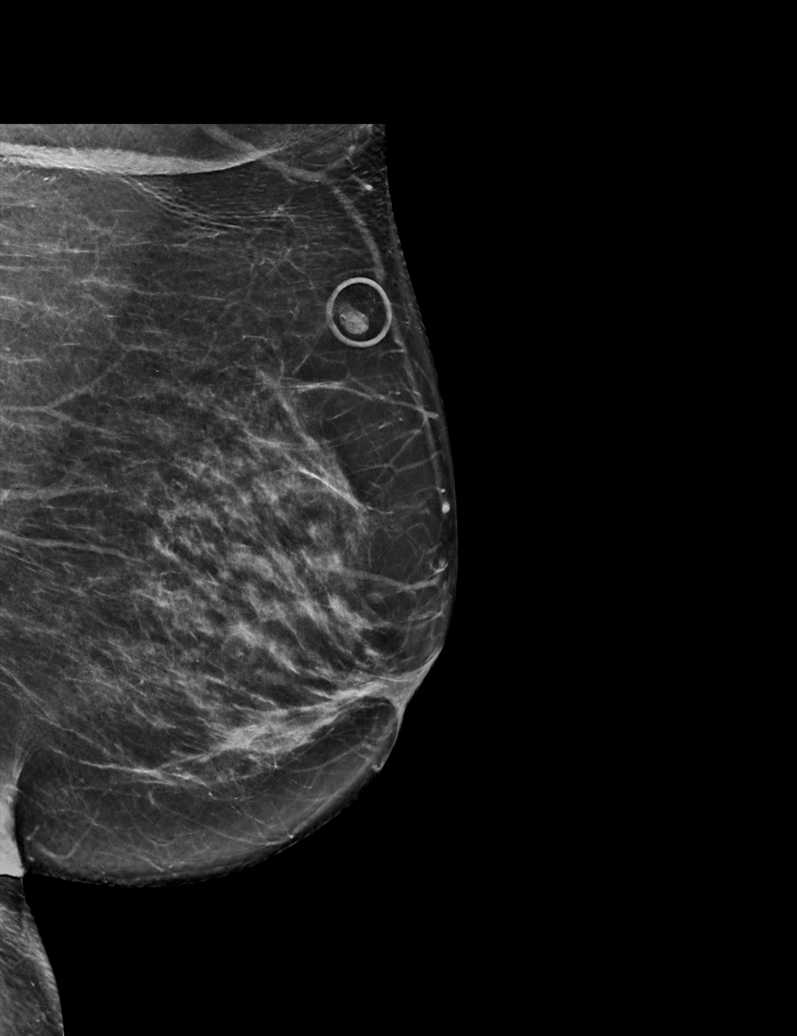

[R CC tomo · tomo slice 36/71.0]
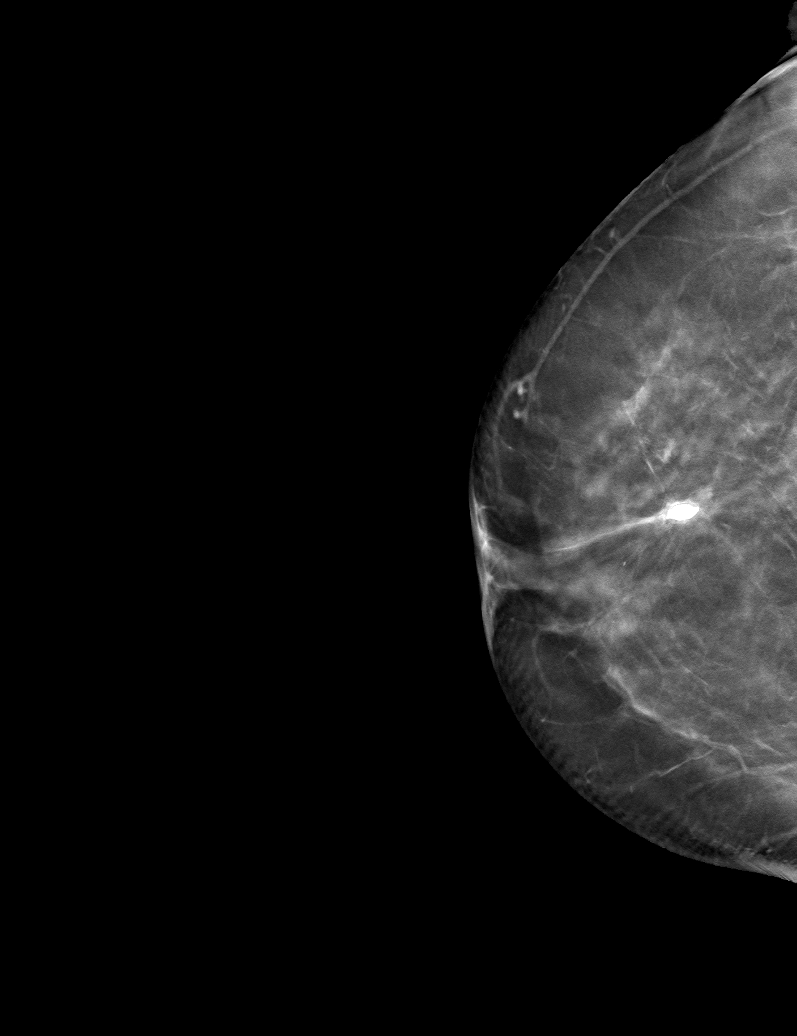

[6 of 30 positions shown; findings below may reference images not displayed]

ACR Breast Density Category c: The breast tissue is heterogeneously
dense, which may obscure small masses.
FINDINGS: There are no findings suspicious for malignancy. Images were
processed with CAD.
IMPRESSION: No mammographic evidence of malignancy. A result letter of this
screening mammogram will be mailed directly to the patient.

RECOMMENDATION:
Screening mammogram in one year. (Code:FT-U-LHB)

BI-RADS CATEGORY  1: Negative.

## 2022-07-26 DIAGNOSIS — M8580 Other specified disorders of bone density and structure, unspecified site: Secondary | ICD-10-CM | POA: Diagnosis not present

## 2022-07-26 DIAGNOSIS — Z79899 Other long term (current) drug therapy: Secondary | ICD-10-CM | POA: Diagnosis not present

## 2022-07-26 DIAGNOSIS — R7989 Other specified abnormal findings of blood chemistry: Secondary | ICD-10-CM | POA: Diagnosis not present

## 2022-07-26 DIAGNOSIS — M8589 Other specified disorders of bone density and structure, multiple sites: Secondary | ICD-10-CM | POA: Diagnosis not present

## 2022-08-02 DIAGNOSIS — Z0000 Encounter for general adult medical examination without abnormal findings: Secondary | ICD-10-CM | POA: Diagnosis not present

## 2022-08-02 DIAGNOSIS — Z1339 Encounter for screening examination for other mental health and behavioral disorders: Secondary | ICD-10-CM | POA: Diagnosis not present

## 2022-08-02 DIAGNOSIS — Z1212 Encounter for screening for malignant neoplasm of rectum: Secondary | ICD-10-CM | POA: Diagnosis not present

## 2022-08-02 DIAGNOSIS — M8580 Other specified disorders of bone density and structure, unspecified site: Secondary | ICD-10-CM | POA: Diagnosis not present

## 2022-08-02 DIAGNOSIS — Z853 Personal history of malignant neoplasm of breast: Secondary | ICD-10-CM | POA: Diagnosis not present

## 2022-08-02 DIAGNOSIS — Z1331 Encounter for screening for depression: Secondary | ICD-10-CM | POA: Diagnosis not present

## 2022-08-02 DIAGNOSIS — R82998 Other abnormal findings in urine: Secondary | ICD-10-CM | POA: Diagnosis not present

## 2023-03-15 DIAGNOSIS — Z23 Encounter for immunization: Secondary | ICD-10-CM | POA: Diagnosis not present

## 2023-04-03 ENCOUNTER — Other Ambulatory Visit: Payer: Self-pay | Admitting: Internal Medicine

## 2023-04-03 DIAGNOSIS — Z1231 Encounter for screening mammogram for malignant neoplasm of breast: Secondary | ICD-10-CM

## 2023-05-21 ENCOUNTER — Ambulatory Visit
Admission: RE | Admit: 2023-05-21 | Discharge: 2023-05-21 | Disposition: A | Discharge status: Home / Self Care | Payer: Medicare Other | Source: Ambulatory Visit | Attending: Internal Medicine | Admitting: Internal Medicine

## 2023-05-21 DIAGNOSIS — Z1231 Encounter for screening mammogram for malignant neoplasm of breast: Secondary | ICD-10-CM

## 2023-08-02 DIAGNOSIS — M8580 Other specified disorders of bone density and structure, unspecified site: Secondary | ICD-10-CM | POA: Diagnosis not present

## 2023-08-02 DIAGNOSIS — Z1212 Encounter for screening for malignant neoplasm of rectum: Secondary | ICD-10-CM | POA: Diagnosis not present

## 2023-08-02 DIAGNOSIS — R7989 Other specified abnormal findings of blood chemistry: Secondary | ICD-10-CM | POA: Diagnosis not present

## 2023-08-09 DIAGNOSIS — Z1331 Encounter for screening for depression: Secondary | ICD-10-CM | POA: Diagnosis not present

## 2023-08-09 DIAGNOSIS — Z1339 Encounter for screening examination for other mental health and behavioral disorders: Secondary | ICD-10-CM | POA: Diagnosis not present

## 2023-08-09 DIAGNOSIS — M25561 Pain in right knee: Secondary | ICD-10-CM | POA: Diagnosis not present

## 2023-08-09 DIAGNOSIS — J302 Other seasonal allergic rhinitis: Secondary | ICD-10-CM | POA: Diagnosis not present

## 2023-08-09 DIAGNOSIS — Z0000 Encounter for general adult medical examination without abnormal findings: Secondary | ICD-10-CM | POA: Diagnosis not present

## 2023-08-09 DIAGNOSIS — I10 Essential (primary) hypertension: Secondary | ICD-10-CM | POA: Diagnosis not present

## 2023-08-09 DIAGNOSIS — Z853 Personal history of malignant neoplasm of breast: Secondary | ICD-10-CM | POA: Diagnosis not present

## 2023-08-09 DIAGNOSIS — R82998 Other abnormal findings in urine: Secondary | ICD-10-CM | POA: Diagnosis not present

## 2023-08-09 DIAGNOSIS — M8580 Other specified disorders of bone density and structure, unspecified site: Secondary | ICD-10-CM | POA: Diagnosis not present

## 2023-08-23 DIAGNOSIS — J302 Other seasonal allergic rhinitis: Secondary | ICD-10-CM | POA: Diagnosis not present

## 2023-08-23 DIAGNOSIS — I10 Essential (primary) hypertension: Secondary | ICD-10-CM | POA: Diagnosis not present

## 2024-02-20 DIAGNOSIS — H02831 Dermatochalasis of right upper eyelid: Secondary | ICD-10-CM | POA: Diagnosis not present

## 2024-02-20 DIAGNOSIS — H02834 Dermatochalasis of left upper eyelid: Secondary | ICD-10-CM | POA: Diagnosis not present

## 2024-02-20 DIAGNOSIS — H43813 Vitreous degeneration, bilateral: Secondary | ICD-10-CM | POA: Diagnosis not present

## 2024-02-20 DIAGNOSIS — H25813 Combined forms of age-related cataract, bilateral: Secondary | ICD-10-CM | POA: Diagnosis not present

## 2024-02-20 DIAGNOSIS — H526 Other disorders of refraction: Secondary | ICD-10-CM | POA: Diagnosis not present

## 2024-03-12 DIAGNOSIS — Z23 Encounter for immunization: Secondary | ICD-10-CM | POA: Diagnosis not present

## 2024-04-01 ENCOUNTER — Other Ambulatory Visit: Payer: Self-pay | Admitting: Internal Medicine

## 2024-04-01 DIAGNOSIS — Z1231 Encounter for screening mammogram for malignant neoplasm of breast: Secondary | ICD-10-CM

## 2024-05-22 ENCOUNTER — Inpatient Hospital Stay: Admission: RE | Admit: 2024-05-22 | Discharge: 2024-05-22 | Attending: Internal Medicine | Admitting: Internal Medicine

## 2024-05-22 DIAGNOSIS — Z1231 Encounter for screening mammogram for malignant neoplasm of breast: Secondary | ICD-10-CM
